# Patient Record
Sex: Female | Born: 1965 | Race: White | Hispanic: Yes | Marital: Married | Smoking: Never smoker
Health system: Southern US, Community
[De-identification: ages and names within clinical notes are randomized; demographics above are authoritative.]

## PROBLEM LIST (undated history)

## (undated) DIAGNOSIS — C189 Malignant neoplasm of colon, unspecified: Secondary | ICD-10-CM

## (undated) DIAGNOSIS — I10 Essential (primary) hypertension: Secondary | ICD-10-CM

## (undated) DIAGNOSIS — C801 Malignant (primary) neoplasm, unspecified: Secondary | ICD-10-CM

## (undated) DIAGNOSIS — Z8719 Personal history of other diseases of the digestive system: Secondary | ICD-10-CM

## (undated) DIAGNOSIS — K5790 Diverticulosis of intestine, part unspecified, without perforation or abscess without bleeding: Secondary | ICD-10-CM

## (undated) HISTORY — DX: Diverticulosis of intestine, part unspecified, without perforation or abscess without bleeding: K57.90

## (undated) HISTORY — DX: Essential (primary) hypertension: I10

## (undated) HISTORY — PX: BREAST EXCISIONAL BIOPSY: SUR124

## (undated) HISTORY — PX: CHOLECYSTECTOMY: SHX55

## (undated) HISTORY — PX: BREAST BIOPSY: SHX20

## (undated) HISTORY — DX: Malignant neoplasm of colon, unspecified: C18.9

---

## 1998-12-01 ENCOUNTER — Encounter: Admission: RE | Admit: 1998-12-01 | Discharge: 1998-12-01 | Payer: Self-pay | Admitting: Internal Medicine

## 1999-01-02 ENCOUNTER — Encounter: Admission: RE | Admit: 1999-01-02 | Discharge: 1999-01-02 | Payer: Self-pay | Admitting: Internal Medicine

## 2001-07-20 ENCOUNTER — Other Ambulatory Visit: Admission: RE | Admit: 2001-07-20 | Discharge: 2001-07-20 | Payer: Self-pay | Admitting: Family Medicine

## 2004-01-27 ENCOUNTER — Inpatient Hospital Stay (HOSPITAL_COMMUNITY): Admission: AD | Admit: 2004-01-27 | Discharge: 2004-01-29 | Payer: Self-pay | Admitting: Obstetrics & Gynecology

## 2005-09-22 ENCOUNTER — Other Ambulatory Visit: Admission: RE | Admit: 2005-09-22 | Discharge: 2005-09-22 | Payer: Self-pay | Admitting: Obstetrics and Gynecology

## 2005-12-20 HISTORY — PX: BREAST MASS EXCISION: SHX1267

## 2005-12-27 ENCOUNTER — Ambulatory Visit: Payer: Self-pay | Admitting: *Deleted

## 2006-01-11 ENCOUNTER — Encounter: Payer: Self-pay | Admitting: Cardiology

## 2006-01-11 ENCOUNTER — Ambulatory Visit: Payer: Self-pay

## 2006-01-18 ENCOUNTER — Ambulatory Visit: Payer: Self-pay | Admitting: *Deleted

## 2006-06-27 ENCOUNTER — Ambulatory Visit (HOSPITAL_COMMUNITY): Admission: RE | Admit: 2006-06-27 | Discharge: 2006-06-27 | Payer: Self-pay | Admitting: Obstetrics and Gynecology

## 2006-07-20 ENCOUNTER — Ambulatory Visit (HOSPITAL_COMMUNITY): Admission: RE | Admit: 2006-07-20 | Discharge: 2006-07-20 | Payer: Self-pay | Admitting: Obstetrics and Gynecology

## 2006-08-17 ENCOUNTER — Encounter: Admission: RE | Admit: 2006-08-17 | Discharge: 2006-08-17 | Payer: Self-pay | Admitting: General Surgery

## 2006-08-17 ENCOUNTER — Ambulatory Visit (HOSPITAL_BASED_OUTPATIENT_CLINIC_OR_DEPARTMENT_OTHER): Admission: RE | Admit: 2006-08-17 | Discharge: 2006-08-17 | Payer: Self-pay | Admitting: General Surgery

## 2006-08-18 ENCOUNTER — Encounter (INDEPENDENT_AMBULATORY_CARE_PROVIDER_SITE_OTHER): Payer: Self-pay | Admitting: *Deleted

## 2007-07-17 ENCOUNTER — Ambulatory Visit (HOSPITAL_COMMUNITY): Admission: RE | Admit: 2007-07-17 | Discharge: 2007-07-17 | Payer: Self-pay | Admitting: Obstetrics and Gynecology

## 2008-04-17 ENCOUNTER — Other Ambulatory Visit: Admission: RE | Admit: 2008-04-17 | Discharge: 2008-04-17 | Payer: Self-pay | Admitting: Obstetrics and Gynecology

## 2008-07-31 ENCOUNTER — Ambulatory Visit (HOSPITAL_COMMUNITY): Admission: RE | Admit: 2008-07-31 | Discharge: 2008-07-31 | Payer: Self-pay | Admitting: Obstetrics and Gynecology

## 2009-04-22 ENCOUNTER — Other Ambulatory Visit: Admission: RE | Admit: 2009-04-22 | Discharge: 2009-04-22 | Payer: Self-pay | Admitting: Obstetrics and Gynecology

## 2009-08-04 ENCOUNTER — Ambulatory Visit (HOSPITAL_COMMUNITY): Admission: RE | Admit: 2009-08-04 | Discharge: 2009-08-04 | Payer: Self-pay | Admitting: Obstetrics and Gynecology

## 2010-06-10 ENCOUNTER — Other Ambulatory Visit: Admission: RE | Admit: 2010-06-10 | Discharge: 2010-06-10 | Payer: Self-pay | Admitting: Obstetrics and Gynecology

## 2010-08-06 ENCOUNTER — Ambulatory Visit (HOSPITAL_COMMUNITY): Admission: RE | Admit: 2010-08-06 | Discharge: 2010-08-06 | Payer: Self-pay | Admitting: Obstetrics and Gynecology

## 2011-05-07 NOTE — H&P (Signed)
NAMEAREIL, OTTEY NO.:  [id number]   MEDICAL RECORD NO.:  [id number]                  PATIENT TYPE:   LOCATION:  LDR 1                                FACILITY:   PHYSICIAN:  Lazaro Arms, M.D.                DATE OF BIRTH:  [birth year]   DATE OF ADMISSION:  DATE OF DISCHARGE:                                HISTORY & PHYSICAL   HISTORY OF PRESENT ILLNESS:  Brittney [name] is a 45 year old Hispanic female, gravida  3, para 2, with estimated date of delivery of February 03, 2004, currently  at 39 weeks' gestation.  The patient suffers with chronic hypertension.  She  had been on blood pressure medicine prior to presenting for her first visit.  She had stopped them by the time we saw her.  Her initial blood pressure was  160/90, and we started her on Aldomet 500 mg p.o. b.i.d.  Her blood pressure  went up by 35 weeks to 160/90, and we increased it to t.i.d.  Her pregnancy  otherwise has been unremarkable.  Her Glucola was elevated, but a three-hour  GTT was normal.  As a result, she is admitted for Foley bulb placement and  induction because of 39 weeks' gestation and chronic hypertension.   PAST MEDICAL HISTORY:  Chronic hypertension.   PAST SURGICAL HISTORY:  Negative.   PAST OBSTETRICAL HISTORY:  She has had two vaginal deliveries in 1994 and  1997, both at term, both vaginal, she is unsure of the weights, and they  were both at [facility].   MEDICATIONS:  She is on no medications except the Aldomet and the prenatal  vitamins.   SOCIAL HISTORY:  She is married and works at [employer].   PRENATAL LABORATORY DATA:  Blood type is O positive, antibody screen is  negative.  Rubella is immune.  Hepatitis B is negative.  HIV is nonreactive.  Serology is nonreactive.  Pap was normal. GC and Chlamydia were negative.  Group B strep was negative.  Glucola, again, was elevated at 168 but three-  hour GTT was normal.   PHYSICAL EXAMINATION:  HEENT:  Unremarkable.  NECK:  Thyroid is normal.  CHEST:  Lungs are clear.  CARDIAC:  Regular rate and rhythm without murmurs, rubs, or gallops.  BREASTS:  Deferred.  ABDOMEN:  Gravid, fundal height of 38 cm.  PELVIC:  Cervix is 1, soft, posterior, vertex, and thick.   IMPRESSION:  1. Intrauterine pregnancy at 32 weeks' gestation.  2. Chronic hypertension.   PLAN:  The patient is admitted for Foley bulb placement and Pitocin  induction of labor.     ___________________________________________                                         Lazaro Arms, M.D.   LHE/MEDQ  D:  01/27/2004  T:  01/27/2004  Job:  [id number]

## 2011-05-07 NOTE — Op Note (Signed)
[name], [name]                         ACCOUNT NO.:  [id number]   MEDICAL RECORD NO.:  [id number]                   PATIENT TYPE:  INP   LOCATION:  LDR1                                 FACILITY:  [facility]   PHYSICIAN:  Tilda Burrow, M.D.              DATE OF BIRTH:  12/31/1965   DATE OF PROCEDURE:  DATE OF DISCHARGE:                                 OPERATIVE REPORT   DELIVERY TIME:  5:07 a.m.   PROCEDURE:  Vacuum assisted vaginal delivery.   DESCRIPTION OF PROCEDURE:  [name] was admitted on the evening of  February 7 for induction of labor.  A Foley balloon was attempted but could  not be placed into the cervix due to the posterior orientation of the  cervix, barely visible with a longer speculum. After failed speculum effort  and bimanual effort and inserting the balloon we proceeded with placement of  25 mcg Cytotec in the posterior fornix which was repeated four hours later.  The patient began to develop blood pressure elevation due to her chronic  hypertension combined with __________ contractions through the early morning  hours of January 28, 2004 requiring intravenous labetalol on several  occasions with baby tolerating labor nicely. She reached 8 cm dilated at 4  a.m., began to have membrane rupture spontaneous which revealed clear  amniotic fluid. She progressed to completely dilated just before 5 a.m. and  pushed through a brief second stage. This was accompanied by significant  decelerations into the 60's.  Recovery was slow but adequate.  Vacuum  assistance was used to expedite delivery and the patient delivered from a  direct OA position a healthy female 7 pounds and 13 ounces (3513 g) without  lacerations or complications.  The vertex was in a right occiput anterior  posterior at the time of expulsion.  The vacuum extractor was properly  placed over the posterior lambdoid suture.  The patient had loose nuchal  cord x1, the infant was placed on the maternal  abdomen, cord clamped and  then cut, then cord blood samples obtained.  The placenta delivered intact  but the membranes fragmented.  Ring forceps were used to extract a couple of  small fragments of membrane remnant from the cervical os.  Banjo curettage  was used with precaution briefly to confirm that no significant amounts of  membrane remained inside the mother's uterus.  Blood loss was 350 mL.      ___________________________________________                                            Tilda Burrow, M.D.   JVF/MEDQ  D:  01/28/2004  T:  01/28/2004  Job:  [id number]   cc:   Donna Bernard, M.D.  [address]  [place]  [place] [address]  Fax: [phone]

## 2011-05-07 NOTE — Op Note (Signed)
NAMEVALINCIA, TOUCH               ACCOUNT NO.:  [id number]   MEDICAL RECORD NO.:  [id number]          PATIENT TYPE:  AMB   LOCATION:  DSC                          FACILITY:  [facility]   PHYSICIAN:  Rose Phi. Maple Hudson, M.D.   DATE OF BIRTH:  [birth year]   DATE OF PROCEDURE:  08/17/2006  DATE OF DISCHARGE:                                 OPERATIVE REPORT   PREOPERATIVE DIAGNOSIS:  Radial scar of the left breast.   POSTOPERATIVE DIAGNOSIS:  Radial scar of the left breast.   OPERATION:  Left breast biopsy with needle localization and specimen  mammogram.   SURGEON:  Rose Phi. Maple Hudson, M.D.   ANESTHESIA:  General.   OPERATIVE PROCEDURE:  Prior to going to the operating room a localizing wire  been placed upper outer quadrant of the left breast.  After suitable general  anesthesia was induced, the left breast was prepped and draped in the usual  fashion.   A curved incision using the previously placed wire at about the 1:30  position was then outlined and then made.  A wide excision of the wire and  surrounding tissue was carried out.  Specimen mammography was performed.  Palpably, there was a little nodule just a medial to this which I excised as  a separate specimen.   With good hemostasis we closed the incision was subcuticular 4-0 Monocryl  and Steri-Strips.   Dressings were applied.  The patient transferred to the recovery room in  satisfactory condition having tolerated procedure well.      Rose Phi. Maple Hudson, M.D.  Electronically Signed     PRY/MEDQ  D:  08/17/2006  T:  08/18/2006  Job:  [id number]

## 2011-07-27 ENCOUNTER — Other Ambulatory Visit: Payer: Self-pay | Admitting: Obstetrics and Gynecology

## 2011-07-27 DIAGNOSIS — Z139 Encounter for screening, unspecified: Secondary | ICD-10-CM

## 2011-08-12 ENCOUNTER — Ambulatory Visit (HOSPITAL_COMMUNITY)
Admission: RE | Admit: 2011-08-12 | Discharge: 2011-08-12 | Disposition: A | Discharge status: Home / Self Care | Payer: 59 | Source: Ambulatory Visit | Attending: Obstetrics and Gynecology | Admitting: Obstetrics and Gynecology

## 2011-08-12 DIAGNOSIS — Z1231 Encounter for screening mammogram for malignant neoplasm of breast: Secondary | ICD-10-CM | POA: Insufficient documentation

## 2011-08-12 DIAGNOSIS — Z139 Encounter for screening, unspecified: Secondary | ICD-10-CM

## 2012-08-01 ENCOUNTER — Other Ambulatory Visit: Payer: Self-pay | Admitting: Obstetrics and Gynecology

## 2012-08-01 DIAGNOSIS — Z139 Encounter for screening, unspecified: Secondary | ICD-10-CM

## 2012-08-28 ENCOUNTER — Ambulatory Visit (HOSPITAL_COMMUNITY)
Admission: RE | Admit: 2012-08-28 | Discharge: 2012-08-28 | Disposition: A | Discharge status: Home / Self Care | Payer: 59 | Source: Ambulatory Visit | Attending: Obstetrics and Gynecology | Admitting: Obstetrics and Gynecology

## 2012-08-28 DIAGNOSIS — Z1231 Encounter for screening mammogram for malignant neoplasm of breast: Secondary | ICD-10-CM | POA: Insufficient documentation

## 2012-08-28 DIAGNOSIS — Z139 Encounter for screening, unspecified: Secondary | ICD-10-CM

## 2012-09-01 ENCOUNTER — Other Ambulatory Visit: Payer: Self-pay | Admitting: Obstetrics and Gynecology

## 2012-09-01 DIAGNOSIS — R928 Other abnormal and inconclusive findings on diagnostic imaging of breast: Secondary | ICD-10-CM

## 2012-09-04 ENCOUNTER — Other Ambulatory Visit: Payer: Self-pay | Admitting: Geriatric Medicine

## 2012-09-04 DIAGNOSIS — R928 Other abnormal and inconclusive findings on diagnostic imaging of breast: Secondary | ICD-10-CM

## 2012-09-07 ENCOUNTER — Other Ambulatory Visit: Payer: Self-pay | Admitting: Geriatric Medicine

## 2012-09-07 ENCOUNTER — Ambulatory Visit
Admission: RE | Admit: 2012-09-07 | Discharge: 2012-09-07 | Disposition: A | Discharge status: Home / Self Care | Payer: 59 | Source: Ambulatory Visit | Attending: Geriatric Medicine | Admitting: Geriatric Medicine

## 2012-09-07 DIAGNOSIS — R928 Other abnormal and inconclusive findings on diagnostic imaging of breast: Secondary | ICD-10-CM

## 2012-09-22 ENCOUNTER — Other Ambulatory Visit: Payer: Self-pay | Admitting: Cardiology

## 2012-09-22 DIAGNOSIS — R011 Cardiac murmur, unspecified: Secondary | ICD-10-CM

## 2012-09-25 ENCOUNTER — Other Ambulatory Visit (INDEPENDENT_AMBULATORY_CARE_PROVIDER_SITE_OTHER): Payer: 59

## 2012-09-25 ENCOUNTER — Other Ambulatory Visit: Payer: Self-pay

## 2012-09-25 DIAGNOSIS — R011 Cardiac murmur, unspecified: Secondary | ICD-10-CM

## 2012-09-27 ENCOUNTER — Encounter (HOSPITAL_COMMUNITY): Payer: Self-pay | Admitting: Geriatric Medicine

## 2012-10-03 ENCOUNTER — Other Ambulatory Visit: Payer: 59

## 2013-02-06 ENCOUNTER — Other Ambulatory Visit: Payer: Self-pay | Admitting: Obstetrics and Gynecology

## 2013-02-06 DIAGNOSIS — R922 Inconclusive mammogram: Secondary | ICD-10-CM

## 2013-02-12 ENCOUNTER — Ambulatory Visit
Admission: RE | Admit: 2013-02-12 | Discharge: 2013-02-12 | Disposition: A | Discharge status: Home / Self Care | Payer: 59 | Source: Ambulatory Visit | Attending: Obstetrics and Gynecology | Admitting: Obstetrics and Gynecology

## 2013-06-08 ENCOUNTER — Encounter: Payer: Self-pay | Admitting: Cardiovascular Disease

## 2013-06-08 ENCOUNTER — Ambulatory Visit (INDEPENDENT_AMBULATORY_CARE_PROVIDER_SITE_OTHER): Payer: 59 | Admitting: Cardiovascular Disease

## 2013-06-08 VITALS — BP 162/90 | HR 70 | Ht 66.0 in | Wt 153.8 lb

## 2013-06-08 DIAGNOSIS — R011 Cardiac murmur, unspecified: Secondary | ICD-10-CM | POA: Insufficient documentation

## 2013-06-08 DIAGNOSIS — I10 Essential (primary) hypertension: Secondary | ICD-10-CM

## 2013-06-08 MED ORDER — AMLODIPINE BESYLATE 5 MG PO TABS: 5.0000 mg | ORAL_TABLET | Freq: Every day | ORAL | Status: DC

## 2013-06-08 NOTE — Assessment & Plan Note (Signed)
The cardiac murmur does not seem to be as prominent as it was described on recent exam. I suspect that this is a flow murmur. I reviewed her echocardiogram which was done in October of last year which showed no significant valvular abnormalities and no evidence of LVOT obstruction.

## 2013-06-08 NOTE — Patient Instructions (Addendum)
Start Amlodipine 5 mg once daily. Continue other medications.   Your physician has requested that you have a renal artery duplex. During this test, an ultrasound is used to evaluate blood flow to the kidneys. Allow one hour for this exam. Do not eat after midnight the day before and avoid carbonated beverages. Take your medications as you usually do.  Follow up after test.

## 2013-06-08 NOTE — Assessment & Plan Note (Signed)
The patient has refractory hypertension. Her blood pressure is still significantly elevated in spite of being on 3 different blood pressure medications including a diuretic. I definitely think we should rule out secondary hypertension. Her physical exam is not suggestive of aortic coarctation given her preserved distal pulses. I reviewed her recent labs which showed unremarkable CBC and basic metabolic profile. Liver function tests were also normal with normal albumin level. Lipid profile was unremarkable except for mild hypertriglyceridemia. Thyroid function was normal. Based on that, it's unlikely that she has hyperaldosteronism or renal disease. I do think we have to rule out renal artery stenosis due to fibromuscular dysplasia. Thus, I will obtain renal artery duplex ultrasound. She has no symptoms suggestive of sleep apnea. I will add amlodipine 5 mg once daily. Continue other medications.

## 2013-06-08 NOTE — Progress Notes (Signed)
HPI  This is a pleasant 47 year old Hispanic female who was referred by Dr. Mayford Knife for evaluation of refractory hypertension and a cardiac murmur. The patient is not aware of any previous cardiac history. She reports being diagnosed with hypertension over the last few years which has been difficult to control lately. During a recent visit she was noted to have a blood pressure of 200/110. She was given one dose of clonidine. She has been on lisinopril, hydrochlorothiazide and Bisoprolol.   She denies any chest pain, dyspnea or palpitations.she reports no headache, dizziness or syncope. She doesn't know exactly her family history but does report that her mother was diagnosed with hypertension at a similar age. She was noted recently to have a 2/6 systolic murmur. This was noted also last year. She underwent an echocardiogram in October of last year which overall was unremarkable.   No Known Allergies   No current outpatient prescriptions on file prior to visit.   No current facility-administered medications on file prior to visit.     Past Medical History  Diagnosis Date  . Hypertension      Past Surgical History  Procedure Laterality Date  . Cholecystectomy       History reviewed. No pertinent family history.   History   Social History  . Marital Status: Married    Spouse Name: N/A    Number of Children: N/A  . Years of Education: N/A   Occupational History  . Not on file.   Social History Main Topics  . Smoking status: Never Smoker   . Smokeless tobacco: Not on file  . Alcohol Use: No  . Drug Use: No  . Sexually Active: Not on file   Other Topics Concern  . Not on file   Social History Narrative  . No narrative on file     ROS Constitutional: Negative for fever, chills, diaphoresis, activity change, appetite change and fatigue.  HENT: Negative for hearing loss, nosebleeds, congestion, sore throat, facial swelling, drooling, trouble swallowing, neck  pain, voice change, sinus pressure and tinnitus.  Eyes: Negative for photophobia, pain, discharge and visual disturbance.  Respiratory: Negative for apnea, cough, chest tightness, shortness of breath and wheezing.  Cardiovascular: Negative for chest pain, palpitations and leg swelling.  Gastrointestinal: Negative for nausea, vomiting, abdominal pain, diarrhea, constipation, blood in stool and abdominal distention.  Genitourinary: Negative for dysuria, urgency, frequency, hematuria and decreased urine volume.  Musculoskeletal: Negative for myalgias, back pain, joint swelling, arthralgias and gait problem.  Skin: Negative for color change, pallor, rash and wound.  Neurological: Negative for dizziness, tremors, seizures, syncope, speech difficulty, weakness, light-headedness, numbness and headaches.  Psychiatric/Behavioral: Negative for suicidal ideas, hallucinations, behavioral problems and agitation. The patient is not nervous/anxious.     PHYSICAL EXAM   BP 162/90  Pulse 70  Ht 5\' 6"  (1.676 m)  Wt 153 lb 12 oz (69.741 kg)  BMI 24.83 kg/m2 Constitutional: She is oriented to person, place, and time. She appears well-developed and well-nourished. No distress.  HENT: No nasal discharge.  Head: Normocephalic and atraumatic.  Eyes: Pupils are equal and round. Right eye exhibits no discharge. Left eye exhibits no discharge.  Neck: Normal range of motion. Neck supple. No JVD present. No thyromegaly present.  Cardiovascular: Normal rate, regular rhythm, normal heart sounds. Exam reveals no gallop and no friction rub. There is a 1/6 systolic ejection murmur at the aortic area.  Pulmonary/Chest: Effort normal and breath sounds normal. No stridor. No respiratory distress. She  has no wheezes. She has no rales. She exhibits no tenderness.  Abdominal: Soft. Bowel sounds are normal. She exhibits no distension. There is no tenderness. There is no rebound and no guarding. No abdominal bruit    Musculoskeletal: Normal range of motion. She exhibits no edema and no tenderness.  Neurological: She is alert and oriented to person, place, and time. Coordination normal.  Skin: Skin is warm and dry. No rash noted. She is not diaphoretic. No erythema. No pallor.  Psychiatric: She has a normal mood and affect. Her behavior is normal. Judgment and thought content normal.  Normal distal pedal pulses.   ZOX:WRUEA  Rhythm  WITHIN NORMAL LIMITS   ASSESSMENT AND PLAN

## 2013-07-25 ENCOUNTER — Other Ambulatory Visit: Payer: Self-pay | Admitting: Obstetrics and Gynecology

## 2013-07-25 DIAGNOSIS — N6002 Solitary cyst of left breast: Secondary | ICD-10-CM

## 2013-09-05 ENCOUNTER — Encounter (INDEPENDENT_AMBULATORY_CARE_PROVIDER_SITE_OTHER): Payer: 59

## 2013-09-05 ENCOUNTER — Ambulatory Visit
Admission: RE | Admit: 2013-09-05 | Discharge: 2013-09-05 | Disposition: A | Discharge status: Home / Self Care | Payer: 59 | Source: Ambulatory Visit | Attending: Obstetrics and Gynecology | Admitting: Obstetrics and Gynecology

## 2013-09-05 DIAGNOSIS — I10 Essential (primary) hypertension: Secondary | ICD-10-CM

## 2013-09-05 DIAGNOSIS — N6002 Solitary cyst of left breast: Secondary | ICD-10-CM

## 2013-09-06 ENCOUNTER — Telehealth: Payer: Self-pay

## 2013-09-06 NOTE — Telephone Encounter (Signed)
Message copied by [name] on Thu Sep 06, 2013  2:37 PM ------      Message from: Brittney [name]      Created: Thu Sep 06, 2013  1:33 PM       Inform patient that renal artery ultrasound was normal. She is due for [name] follow up. ------

## 2013-09-06 NOTE — Telephone Encounter (Signed)
Spoke w/ pt's husband.  He will make pt aware of results and have her call the office for a f/u appt.

## 2014-02-12 IMAGING — US US BREAST*R*
1 series · 9 of 9 positions shown · non-contrast
Comparison: 08/28/2012 and earlier

CLINICAL DATA: The patient returns after screening study for
evaluation of the left breast.

DIGITAL DIAGNOSTIC LEFT MAMMOGRAM WITH CAD AND LEFT BREAST
ULTRASOUND:

[Series 1: us breast*right* · 9 of 9 slices shown]
[im 1/9]
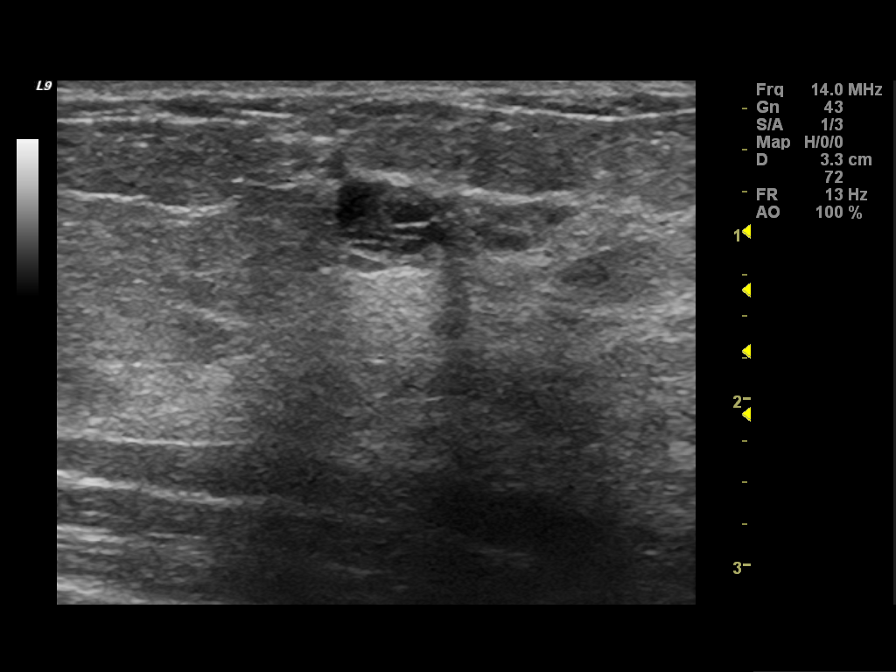
[im 2/9]
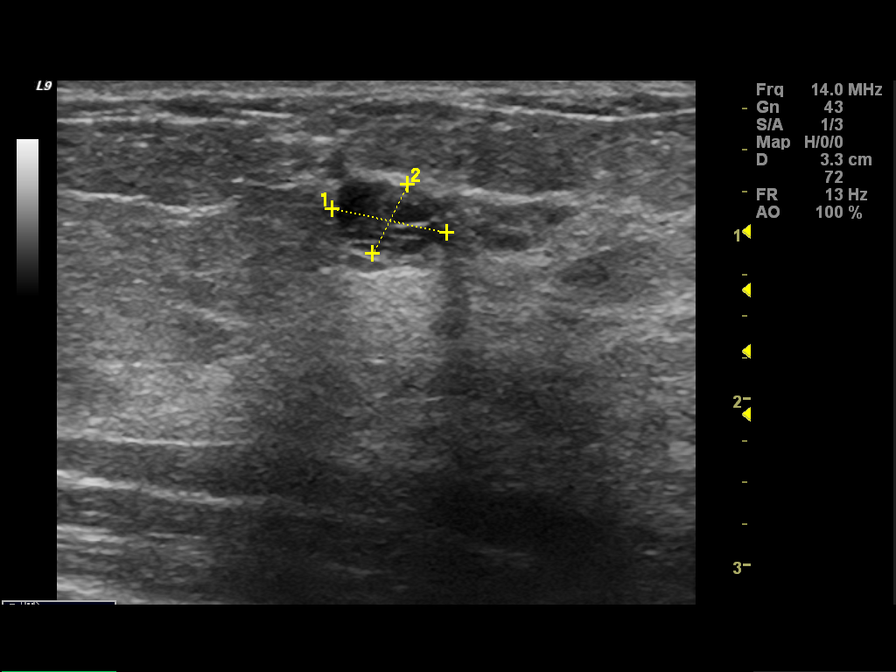
[im 3/9]
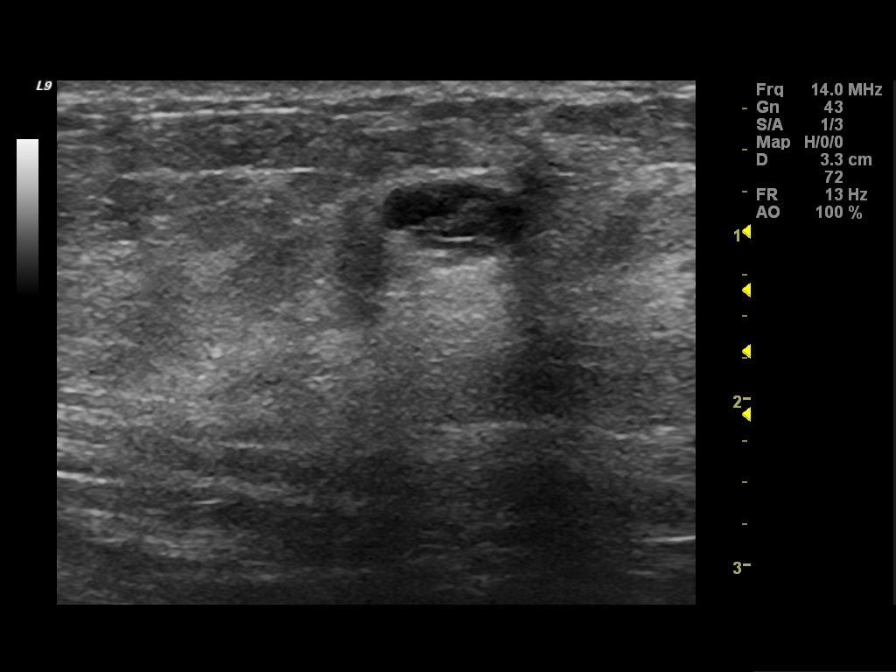
[im 4/9]
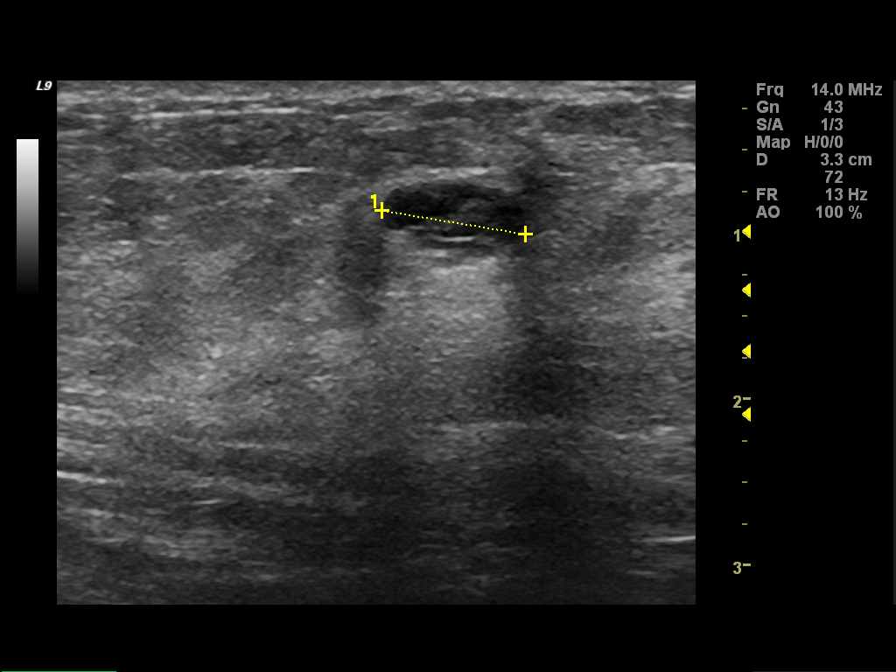
[im 5/9]
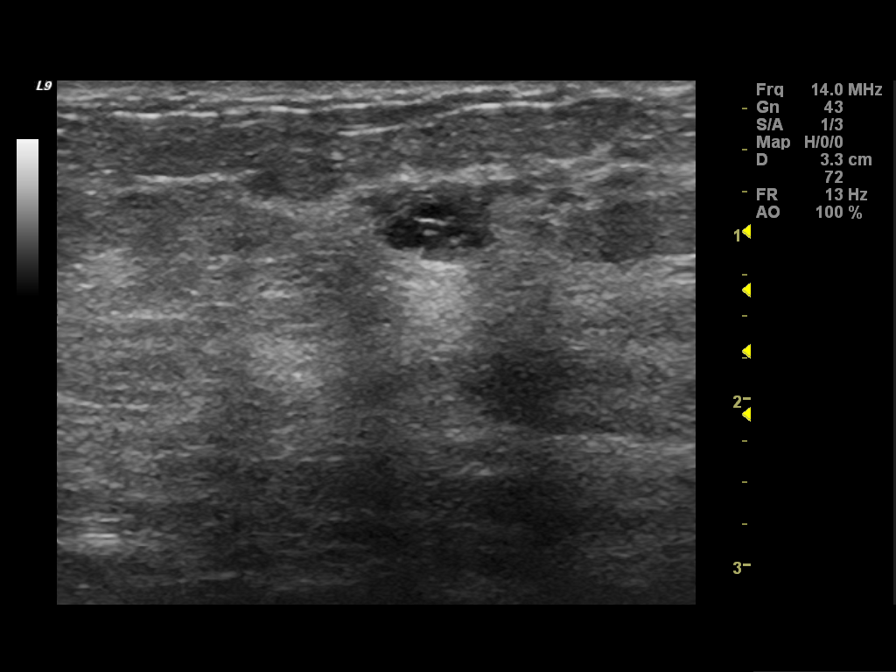
[im 6/9]
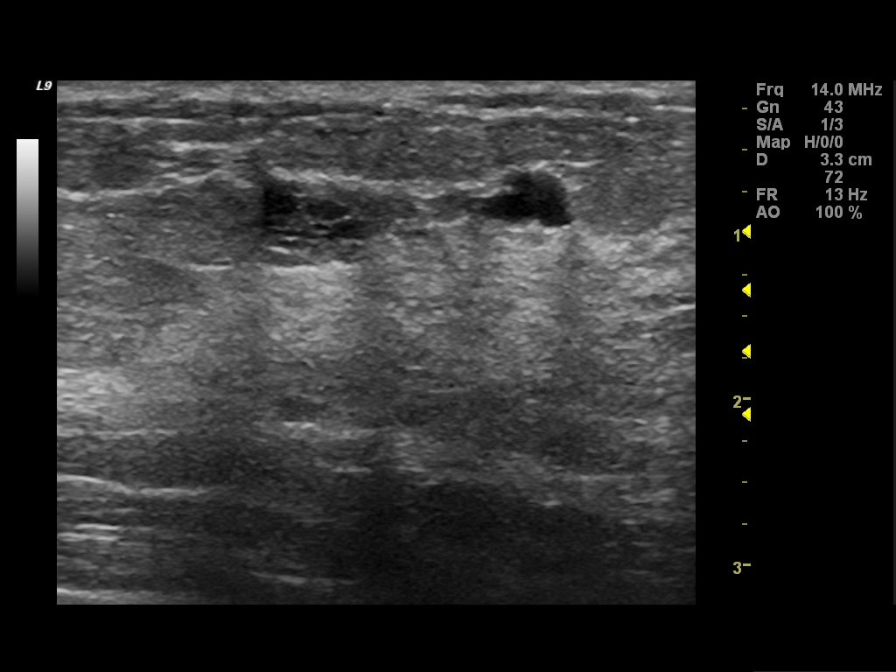
[im 7/9]
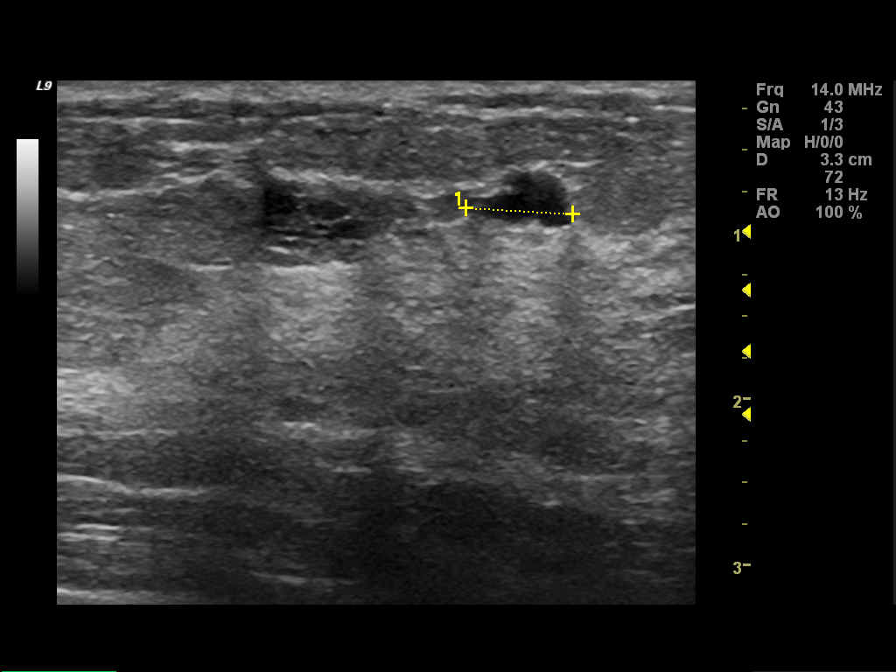
[im 8/9]
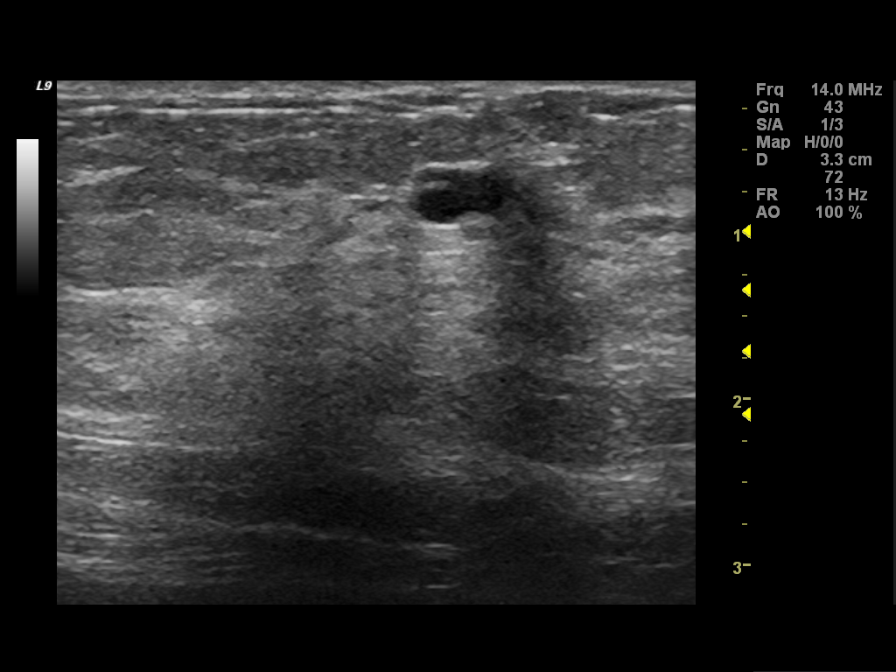
[im 9/9]
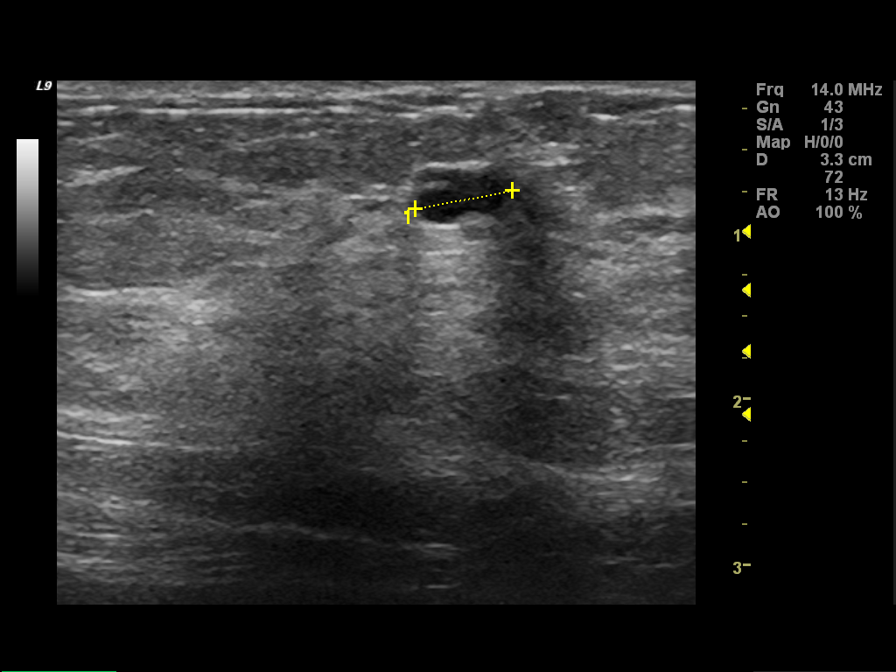

[9 of 9 positions shown; findings below may reference images not displayed]

FINDINGS: Breast parenchyma is extremely dense.  Spot compression
views showed dense fibroglandular tissue in the medial aspect of
the left breast.  No persistent mass or distortion identified.
Scattered, benign-appearing calcifications are present.
Mammographic images were processed with CAD.

On physical exam, I palpate no abnormality within the medial
portion of the left breast.

Ultrasound is performed, showing a to foci of apocrine metaplasia
within the 11 o'clock location of the left breast, 7 cm from
nipple.  These measures 7 x 5 x 9 mm and 6 x 6 mm.  No solid mass
or area of acoustic shadowing identified.
IMPRESSION: No mammographic or ultrasound evidence for malignancy.
Probable apocrine metaplasia in the 11 o'clock location of the left
breast warrants further follow-up.  I discussed the findings with
the patient and her son, who serves as interpreter.  Questions were
answered.

RECOMMENDATION:
Left breast ultrasound 6 months.

BI-RADS CATEGORY 3:  Probably benign finding(s) - short interval
follow-up suggested.

## 2014-02-13 IMAGING — MG MM DIGITAL DIAGNOSTIC UNILAT*L*
2 series · 2 of 2 positions shown · non-contrast
Comparison: 08/28/2012 and earlier

CLINICAL DATA: The patient returns after screening study for
evaluation of the left breast.

DIGITAL DIAGNOSTIC LEFT MAMMOGRAM WITH CAD AND LEFT BREAST
ULTRASOUND:

[L CC]
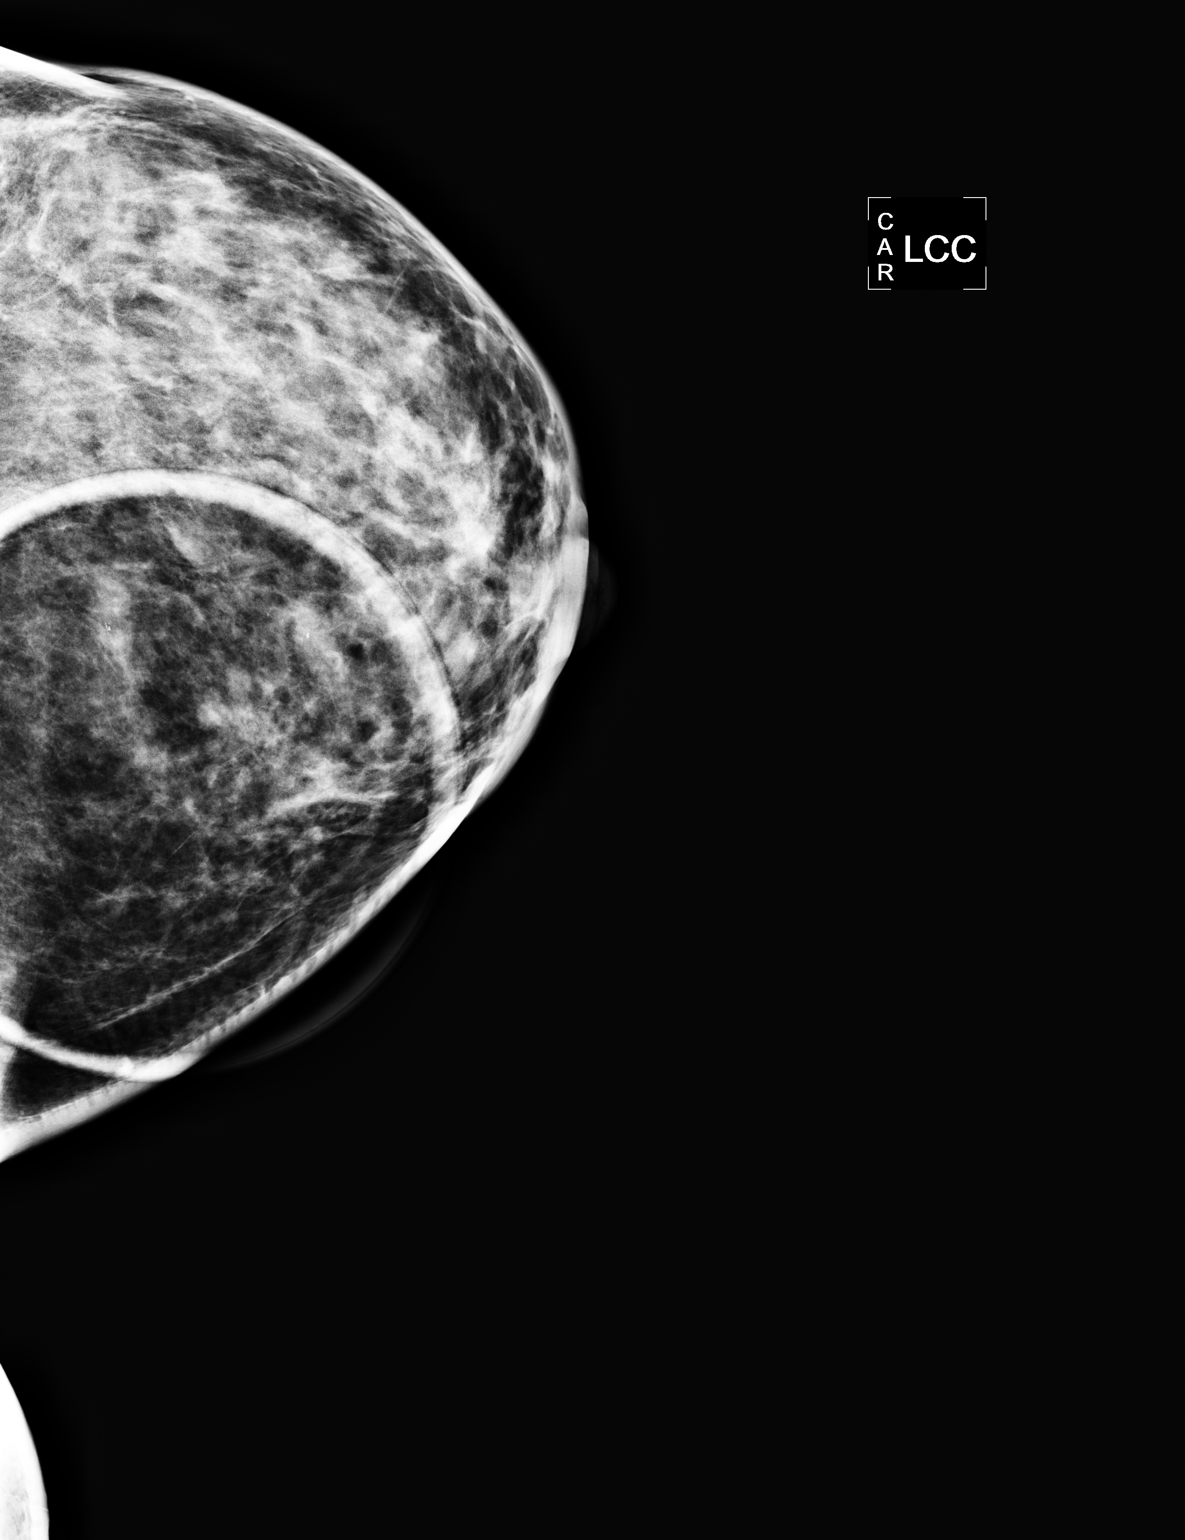

[L ML]
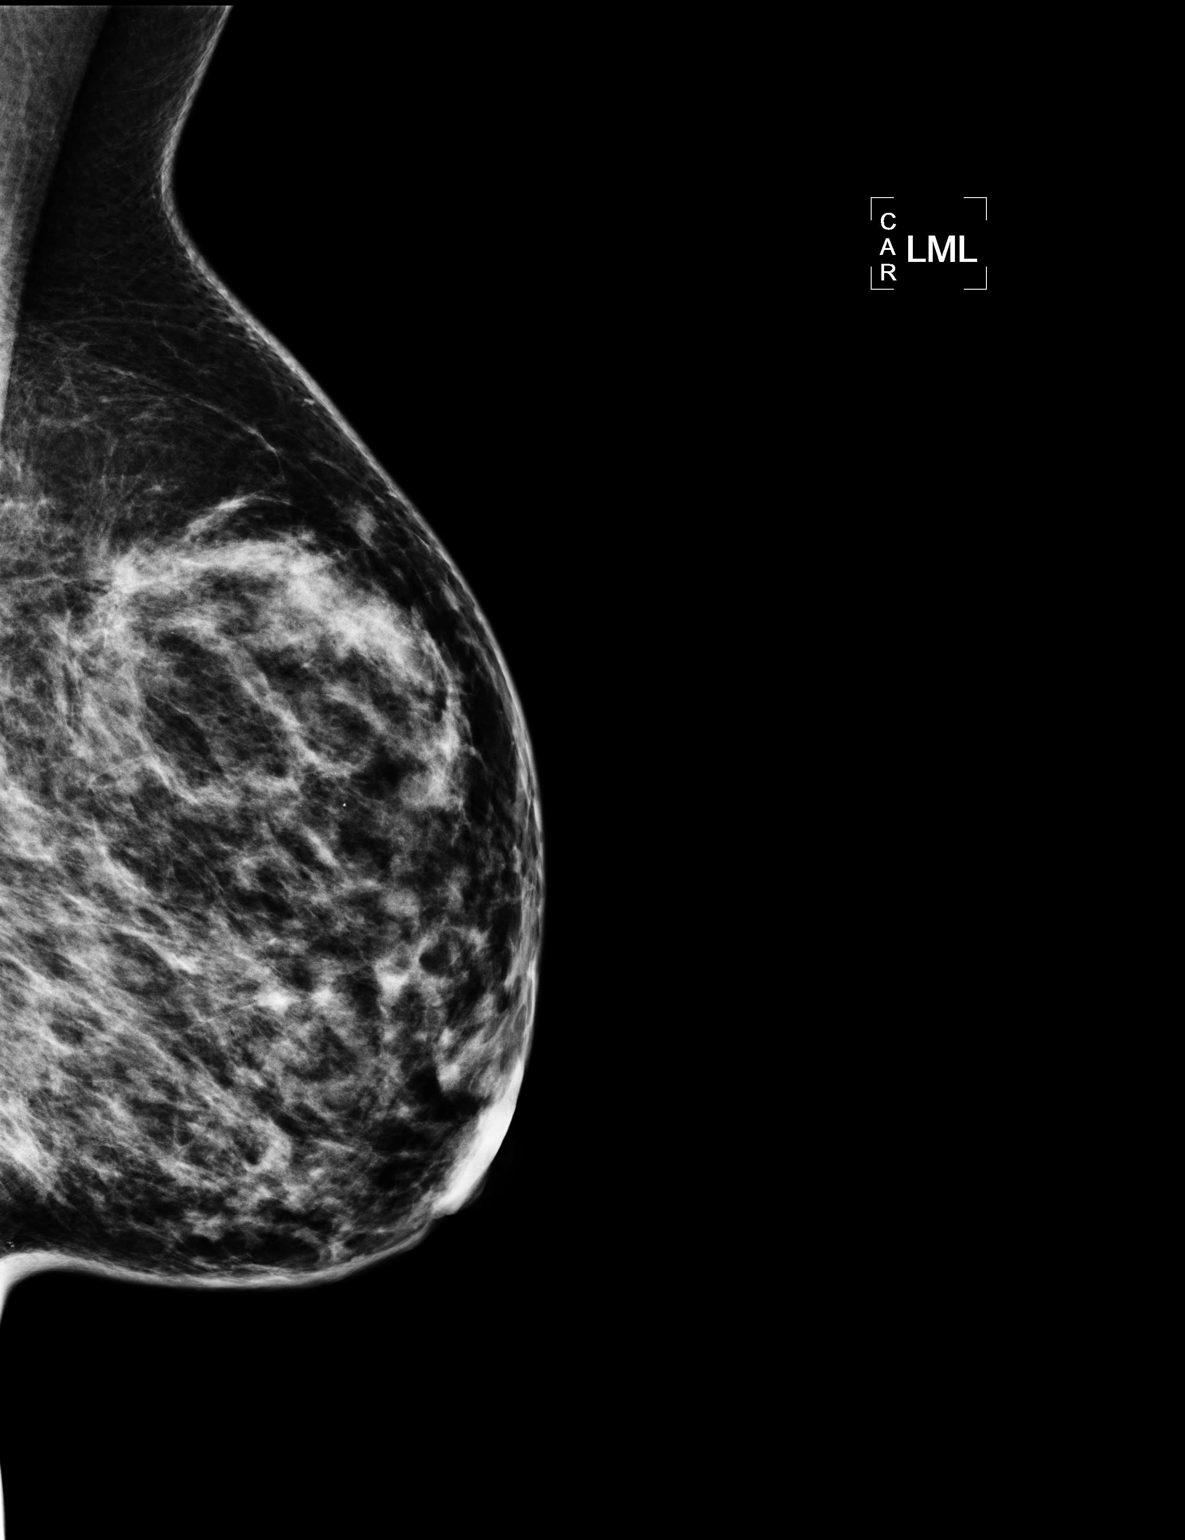

[2 of 2 positions shown; findings below may reference images not displayed]

FINDINGS: Breast parenchyma is extremely dense.  Spot compression
views showed dense fibroglandular tissue in the medial aspect of
the left breast.  No persistent mass or distortion identified.
Scattered, benign-appearing calcifications are present.
Mammographic images were processed with CAD.

On physical exam, I palpate no abnormality within the medial
portion of the left breast.

Ultrasound is performed, showing a to foci of apocrine metaplasia
within the 11 o'clock location of the left breast, 7 cm from
nipple.  These measures 7 x 5 x 9 mm and 6 x 6 mm.  No solid mass
or area of acoustic shadowing identified.
IMPRESSION: No mammographic or ultrasound evidence for malignancy.
Probable apocrine metaplasia in the 11 o'clock location of the left
breast warrants further follow-up.  I discussed the findings with
the patient and her son, who serves as interpreter.  Questions were
answered.

RECOMMENDATION:
Left breast ultrasound 6 months.

BI-RADS CATEGORY 3:  Probably benign finding(s) - short interval
follow-up suggested.

## 2014-09-06 ENCOUNTER — Other Ambulatory Visit: Payer: Self-pay

## 2014-09-06 DIAGNOSIS — Z1231 Encounter for screening mammogram for malignant neoplasm of breast: Secondary | ICD-10-CM

## 2014-09-09 ENCOUNTER — Encounter (INDEPENDENT_AMBULATORY_CARE_PROVIDER_SITE_OTHER): Payer: Self-pay

## 2014-09-09 ENCOUNTER — Ambulatory Visit
Admission: RE | Admit: 2014-09-09 | Discharge: 2014-09-09 | Disposition: A | Discharge status: Home / Self Care | Payer: BC Managed Care – PPO | Source: Ambulatory Visit

## 2014-09-09 DIAGNOSIS — Z1231 Encounter for screening mammogram for malignant neoplasm of breast: Secondary | ICD-10-CM

## 2015-07-02 ENCOUNTER — Encounter: Payer: Self-pay | Admitting: Gastroenterology

## 2015-08-27 ENCOUNTER — Ambulatory Visit (AMBULATORY_SURGERY_CENTER): Payer: Self-pay | Admitting: *Deleted

## 2015-08-27 VITALS — Ht 62.0 in | Wt 156.0 lb

## 2015-08-27 DIAGNOSIS — Z1211 Encounter for screening for malignant neoplasm of colon: Secondary | ICD-10-CM

## 2015-08-27 NOTE — Progress Notes (Signed)
Patient denies any allergies to eggs or soy. Patient denies any problems with anesthesia/sedation. Patient denies any oxygen use at home and does not take any diet/weight loss medications.  

## 2015-09-09 ENCOUNTER — Other Ambulatory Visit (INDEPENDENT_AMBULATORY_CARE_PROVIDER_SITE_OTHER): Payer: BLUE CROSS/BLUE SHIELD

## 2015-09-09 ENCOUNTER — Ambulatory Visit (AMBULATORY_SURGERY_CENTER): Payer: BLUE CROSS/BLUE SHIELD | Admitting: Gastroenterology

## 2015-09-09 ENCOUNTER — Encounter: Payer: Self-pay | Admitting: Gastroenterology

## 2015-09-09 ENCOUNTER — Other Ambulatory Visit: Payer: Self-pay

## 2015-09-09 VITALS — BP 151/90 | HR 80 | Temp 99.1°F | Resp 16 | Ht 62.0 in | Wt 156.0 lb

## 2015-09-09 DIAGNOSIS — Z1211 Encounter for screening for malignant neoplasm of colon: Secondary | ICD-10-CM

## 2015-09-09 DIAGNOSIS — R933 Abnormal findings on diagnostic imaging of other parts of digestive tract: Secondary | ICD-10-CM | POA: Diagnosis not present

## 2015-09-09 DIAGNOSIS — K6389 Other specified diseases of intestine: Secondary | ICD-10-CM

## 2015-09-09 DIAGNOSIS — C187 Malignant neoplasm of sigmoid colon: Secondary | ICD-10-CM | POA: Diagnosis not present

## 2015-09-09 LAB — CBC WITH DIFFERENTIAL/PLATELET
Basophils Absolute: 0 10*3/uL (ref 0.0–0.1)
Basophils Relative: 0.4 % (ref 0.0–3.0)
EOS PCT: 1 % (ref 0.0–5.0)
Eosinophils Absolute: 0.1 10*3/uL (ref 0.0–0.7)
HEMATOCRIT: 43 % (ref 36.0–46.0)
Hemoglobin: 14.5 g/dL (ref 12.0–15.0)
LYMPHS ABS: 1.2 10*3/uL (ref 0.7–4.0)
Lymphocytes Relative: 18.9 % (ref 12.0–46.0)
MCHC: 33.6 g/dL (ref 30.0–36.0)
MCV: 89.4 fl (ref 78.0–100.0)
MONOS PCT: 5.1 % (ref 3.0–12.0)
Monocytes Absolute: 0.3 10*3/uL (ref 0.1–1.0)
NEUTROS ABS: 4.9 10*3/uL (ref 1.4–7.7)
NEUTROS PCT: 74.6 % (ref 43.0–77.0)
PLATELETS: 333 10*3/uL (ref 150.0–400.0)
RBC: 4.81 Mil/uL (ref 3.87–5.11)
RDW: 13.8 % (ref 11.5–15.5)
WBC: 6.6 10*3/uL (ref 4.0–10.5)

## 2015-09-09 LAB — COMPREHENSIVE METABOLIC PANEL
ALK PHOS: 80 U/L (ref 39–117)
ALT: 30 U/L (ref 0–35)
AST: 19 U/L (ref 0–37)
Albumin: 4.4 g/dL (ref 3.5–5.2)
BUN: 8 mg/dL (ref 6–23)
CALCIUM: 9.3 mg/dL (ref 8.4–10.5)
CO2: 26 meq/L (ref 19–32)
Chloride: 105 mEq/L (ref 96–112)
Creatinine, Ser: 0.77 mg/dL (ref 0.40–1.20)
GFR: 84.08 mL/min (ref >60.00)
GLUCOSE: 105 mg/dL — AB (ref 70–99)
POTASSIUM: 3.2 meq/L — AB (ref 3.5–5.1)
Sodium: 140 mEq/L (ref 135–145)
TOTAL PROTEIN: 7.3 g/dL (ref 6.0–8.3)
Total Bilirubin: 0.5 mg/dL (ref 0.2–1.2)

## 2015-09-09 LAB — PROTIME-INR
INR: 1.2 ratio — ABNORMAL HIGH (ref 0.8–1.0)
Prothrombin Time: 13.6 s — ABNORMAL HIGH (ref 9.6–13.1)

## 2015-09-09 LAB — CEA: CEA: 0.7 ng/mL (ref 0.0–5.0)

## 2015-09-09 MED ORDER — SODIUM CHLORIDE 0.9 % IV SOLN: 500.0000 mL | INTRAVENOUS | Status: DC

## 2015-09-09 NOTE — Patient Instructions (Addendum)
YOU HAD AN ENDOSCOPIC PROCEDURE TODAY AT [facility]:   Refer to the procedure report that was given to you for any specific questions about what was found during the examination.  If the procedure report does not answer your questions, please call your gastroenterologist to clarify.  If you requested that your care partner not be given the details of your procedure findings, then the procedure report has been included in a sealed envelope for you to review at your convenience later.  YOU SHOULD EXPECT: Some feelings of bloating in the abdomen. Passage of more gas than usual.  Walking can help get rid of the air that was put into your GI tract during the procedure and reduce the bloating. If you had a lower endoscopy (such as a colonoscopy or flexible sigmoidoscopy) you may notice spotting of blood in your stool or on the toilet paper. If you underwent a bowel prep for your procedure, you may not have a normal bowel movement for a few days.  Please Note:  You might notice some irritation and congestion in your nose or some drainage.  This is from the oxygen used during your procedure.  There is no need for concern and it should clear up in a day or so.  SYMPTOMS TO REPORT IMMEDIATELY:   Following lower endoscopy (colonoscopy or flexible sigmoidoscopy):  Excessive amounts of blood in the stool  Significant tenderness or worsening of abdominal pains  Swelling of the abdomen that is new, acute  Fever of 100F or higher    Black, tarry-looking stools  For urgent or emergent issues, a gastroenterologist can be reached at any hour by calling [phone].   DIET: Your first meal following the procedure should be a small meal and then it is ok to progress to your normal diet. Heavy or fried foods are harder to digest and may make you feel nauseous or bloated.  Likewise, meals heavy in dairy and vegetables can increase bloating.  Drink plenty of fluids but you should avoid alcoholic  beverages for 24 hours.  ACTIVITY:  You should plan to take it easy for the rest of today and you should NOT DRIVE or use heavy machinery until tomorrow (because of the sedation medicines used during the test).    FOLLOW UP: Our staff will call the number listed on your records the next business day following your procedure to check on you and address any questions or concerns that you may have regarding the information given to you following your procedure. If we do not reach you, we will leave a message.  However, if you are feeling well and you are not experiencing any problems, there is no need to return our call.  We will assume that you have returned to your regular daily activities without incident.  If any biopsies were taken you will be contacted by phone or by letter within the next 1-3 weeks.  Please call us at [phone] if you have not heard about the biopsies in 3 weeks.    SIGNATURES/CONFIDENTIALITY: You and/or your care partner have signed paperwork which will be entered into your electronic medical record.  These signatures attest to the fact that that the information above on your After Visit Summary has been reviewed and is understood.  Full responsibility of the confidentiality of this discharge information lies with you and/or your care-partner.    LAB  WORK TO  BE DONE ON DISCHARGE TODAY  You have been scheduled for a  CT scan of the abdomen and pelvis at [facility] ([address]---this is in the same building as Press [facility]).   You are scheduled on 09/11/15 at 3:00pm. You should arrive 15 minutes prior to your appointment time for registration. Please follow the written instructions below on the day of your exam:  WARNING: IF YOU ARE ALLERGIC TO IODINE/X-RAY DYE, PLEASE NOTIFY RADIOLOGY IMMEDIATELY AT [phone]! YOU WILL BE GIVEN A 13 HOUR PREMEDICATION PREP.  1) Do not eat or drink anything after 11:00AM (4 hours prior to your test) 2) You  have been given 2 bottles of oral contrast to drink. The solution may taste  better if refrigerated, but do NOT add ice or any other liquid to this solution. Shake  well before drinking.    Drink 1 bottle of contrast @ 1:00pm (2 hours prior to your exam)  Drink 1 bottle of contrast @ 2:00pm (1 hour prior to your exam)  You may take any medications as prescribed with a small amount of water except for the following: Metformin, Glucophage, Glucovance, Avandamet, Riomet, Fortamet, Actoplus Met, Janumet, Glumetza or Metaglip. The above medications must be held the day of the exam AND 48 hours after the exam.  The purpose of you drinking the oral contrast is to aid in the visualization of your intestinal tract. The contrast solution may cause some diarrhea. Before your exam is started, you will be given a small amount of fluid to drink. Depending on your individual set of symptoms, you may also receive an intravenous injection of x-ray contrast/dye. Plan on being at [facility] for 30 minutes or long, depending on the type of exam you are having performed.  This test typically takes 30-45 minutes to complete.  If you have any questions regarding your exam or if you need to reschedule, you may call the CT department at [phone] between the hours of 8:00 am and 5:00 pm, Monday-Friday.  ________________________________________________________________________

## 2015-09-09 NOTE — Progress Notes (Signed)
Called to room to assist during endoscopic procedure.  Patient ID and intended procedure confirmed with present staff. Received instructions for my participation in the procedure from the performing physician.  

## 2015-09-09 NOTE — Progress Notes (Signed)
Report to PACU, RN, vss, BBS= Clear.  

## 2015-09-09 NOTE — Progress Notes (Signed)
CT SCAN HAS BEEN SET UP AND TIME AND DATE REVIEWED WITH PT BY ANNETTE WILLIS RN AND YESENIA FROM 3RD FLOOR HELPED WITH INTERPRETATION

## 2015-09-09 NOTE — Op Note (Signed)
[facility]  [employer]. [place], [address]   COLONOSCOPY PROCEDURE REPORT  PATIENT: Brittney [name], Brittney [name]  MR#: [id number] BIRTHDATE: 1966-09-11 , 48  yrs. old GENDER: female ENDOSCOPIST: Milus Banister, MD REFERRED [id number] Glennon Mac, P.A. PROCEDURE DATE:  09/09/2015 PROCEDURE:   Colonoscopy, screening, Colonoscopy with biopsy, and Submucosal injection, any substance First Screening Colonoscopy - Avg.  risk and is 50 yrs.  old or older Yes.  Prior Negative Screening - Now for repeat screening. N/A  History of Adenoma - Now for follow-up colonoscopy & has been > or = to 3 yrs.  N/A  high risk ASA CLASS:   Class II INDICATIONS:Screening for colonic neoplasia and Colorectal Neoplasm Risk Assessment for this procedure is average risk. MEDICATIONS: Monitored anesthesia care and Propofol 200 mg IV  DESCRIPTION OF PROCEDURE:   After the risks benefits and alternatives of the procedure were thoroughly explained, informed consent was obtained.  The digital rectal exam revealed no abnormalities of the rectum.   The LB [id number]-H190 [id number]  endoscope was introduced through the anus and advanced to the cecum, which was identified by both the appendix and ileocecal valve. No adverse events experienced.   The quality of the prep was excellent.  The instrument was then slowly withdrawn as the colon was fully examined. Estimated blood loss is zero unless otherwise noted in this procedure report.   COLON FINDINGS: There was a clearly malignant mass in the sigmoid colon (about 30cm from anal verge).  This was non-circumferential and non-obstructive, about 4cm across, ulcerated.  The mass was sampled with biopsy forceps and then labeled at the distal edge with submucosal injection of SPOT.  The examination was otherwise normal.  Retroflexed views revealed no abnormalities. The time to cecum = 2.0 Withdrawal time = 14.4   The scope was withdrawn and the procedure  completed. COMPLICATIONS: There were no immediate complications.  ENDOSCOPIC IMPRESSION: There was a clearly malignant mass in the sigmoid colon (about 30cm from anal verge).  This was non-circumferential and non-obstructive, about 4cm across, ulcerated.  The mass was sampled with biopsy forceps and then labeled at the distal edge with submucosal injection of SPOT.  The examination was otherwise normal   RECOMMENDATIONS: Staging workup to be ordered (CT scan chest, abdomen, pelvis; labs with CEA, CBC, CMET, INR).  My office will arrange referral to general surgery to consider resection for newly diagnosed sigmoid colon cancer.  You will need repeat colonoscopy 1 year post resection.  eSigned:  Milus Banister, MD 09/09/2015 9:03 AM

## 2015-09-09 NOTE — Progress Notes (Signed)
SPOKE WITH Brittney [name] IN DR JACOBS OFFICE WHO IS SETTING UP LABS AND SURGEON CONSULT

## 2015-09-10 ENCOUNTER — Telehealth: Payer: Self-pay | Admitting: *Deleted

## 2015-09-10 NOTE — Telephone Encounter (Signed)
  Follow up Call-  Call back number 09/09/2015  Post procedure Call Back phone  # [phone]  Permission to leave phone message Yes     Patient questions:  Do you have a fever, pain , or abdominal swelling? No. Pain Score  0 *  Have you tolerated food without any problems? Yes.    Have you been able to return to your normal activities? No.  Do you have any questions about your discharge instructions: Diet   No. Medications  No. Follow up visit  No.  Do you have questions or concerns about your Care? No.  Actions: * If pain score is 4 or above: No action needed, pain <4.

## 2015-09-11 ENCOUNTER — Ambulatory Visit (INDEPENDENT_AMBULATORY_CARE_PROVIDER_SITE_OTHER)
Admission: RE | Admit: 2015-09-11 | Discharge: 2015-09-11 | Disposition: A | Discharge status: Home / Self Care | Payer: BLUE CROSS/BLUE SHIELD | Source: Ambulatory Visit | Attending: Gastroenterology | Admitting: Gastroenterology

## 2015-09-11 DIAGNOSIS — K6389 Other specified diseases of intestine: Secondary | ICD-10-CM

## 2015-09-11 DIAGNOSIS — R933 Abnormal findings on diagnostic imaging of other parts of digestive tract: Secondary | ICD-10-CM

## 2015-09-11 MED ORDER — IOHEXOL 300 MG/ML  SOLN
100.0000 mL | Freq: Once | INTRAMUSCULAR | Status: AC | PRN
  Administered 2015-09-11: 100 mL via INTRAVENOUS

## 2015-09-17 ENCOUNTER — Telehealth: Payer: Self-pay | Admitting: Gastroenterology

## 2015-09-18 NOTE — Telephone Encounter (Signed)
I spoke to Brittney [name] today thurs 09-18-15 about her Path Results as per Dr Ardis Hughs asked me to in [place]. She understands and has no questions. She was a little concerned she has not heard back regarding her surgical date yet from [facility].

## 2015-09-18 NOTE — Telephone Encounter (Signed)
Barbie Haggis can you please call her with this info thanks,  Dr Barry Dienes 09/22/15 arrive at 2:30 pm

## 2015-09-18 NOTE — Telephone Encounter (Signed)
Spoke to [place] and gave her date for her Consult with Dr Barry Dienes, address and phone number. Her son will go with her to the appointment to interpret. Has no further questions. She wanted me to tell Dr Ardis Hughs thank you for everything.

## 2015-09-22 ENCOUNTER — Other Ambulatory Visit: Payer: Self-pay | Admitting: General Surgery

## 2015-10-27 ENCOUNTER — Other Ambulatory Visit: Payer: Self-pay

## 2015-10-27 ENCOUNTER — Encounter (HOSPITAL_COMMUNITY): Payer: Self-pay

## 2015-10-27 ENCOUNTER — Ambulatory Visit (HOSPITAL_COMMUNITY)
Admission: RE | Admit: 2015-10-27 | Discharge: 2015-10-27 | Disposition: A | Discharge status: Home / Self Care | Payer: BLUE CROSS/BLUE SHIELD | Source: Ambulatory Visit | Attending: Anesthesiology | Admitting: Anesthesiology

## 2015-10-27 ENCOUNTER — Encounter (HOSPITAL_COMMUNITY)
Admission: RE | Admit: 2015-10-27 | Discharge: 2015-10-27 | Disposition: A | Discharge status: Home / Self Care | Payer: BLUE CROSS/BLUE SHIELD | Source: Ambulatory Visit | Attending: General Surgery | Admitting: General Surgery

## 2015-10-27 DIAGNOSIS — I10 Essential (primary) hypertension: Secondary | ICD-10-CM | POA: Diagnosis not present

## 2015-10-27 DIAGNOSIS — R938 Abnormal findings on diagnostic imaging of other specified body structures: Secondary | ICD-10-CM | POA: Diagnosis present

## 2015-10-27 DIAGNOSIS — Z01818 Encounter for other preprocedural examination: Secondary | ICD-10-CM | POA: Diagnosis present

## 2015-10-27 DIAGNOSIS — Z01812 Encounter for preprocedural laboratory examination: Secondary | ICD-10-CM | POA: Insufficient documentation

## 2015-10-27 DIAGNOSIS — R9389 Abnormal findings on diagnostic imaging of other specified body structures: Secondary | ICD-10-CM

## 2015-10-27 HISTORY — DX: Malignant (primary) neoplasm, unspecified: C80.1

## 2015-10-27 HISTORY — DX: Personal history of other diseases of the digestive system: Z87.19

## 2015-10-27 LAB — BASIC METABOLIC PANEL
ANION GAP: 8 (ref 5–15)
BUN: 12 mg/dL (ref 6–20)
CALCIUM: 10.1 mg/dL (ref 8.9–10.3)
CO2: 26 mmol/L (ref 22–32)
CREATININE: 0.74 mg/dL (ref 0.44–1.00)
Chloride: 107 mmol/L (ref 101–111)
GFR calc Af Amer: >60 mL/min (ref >60)
GLUCOSE: 113 mg/dL — AB (ref 65–99)
Potassium: 4.3 mmol/L (ref 3.5–5.1)
Sodium: 141 mmol/L (ref 135–145)

## 2015-10-27 LAB — CBC WITH DIFFERENTIAL/PLATELET
Basophils Absolute: 0 10*3/uL (ref 0.0–0.1)
Basophils Relative: 1 %
EOS ABS: 0.1 10*3/uL (ref 0.0–0.7)
EOS PCT: 1 %
HCT: 43.4 % (ref 36.0–46.0)
Hemoglobin: 15.2 g/dL — ABNORMAL HIGH (ref 12.0–15.0)
LYMPHS ABS: 1.5 10*3/uL (ref 0.7–4.0)
LYMPHS PCT: 19 %
MCH: 30.9 pg (ref 26.0–34.0)
MCHC: 35 g/dL (ref 30.0–36.0)
MCV: 88.2 fL (ref 78.0–100.0)
MONO ABS: 0.4 10*3/uL (ref 0.1–1.0)
MONOS PCT: 5 %
Neutro Abs: 5.8 10*3/uL (ref 1.7–7.7)
Neutrophils Relative %: 74 %
PLATELETS: 314 10*3/uL (ref 150–400)
RBC: 4.92 MIL/uL (ref 3.87–5.11)
RDW: 13.4 % (ref 11.5–15.5)
WBC: 7.9 10*3/uL (ref 4.0–10.5)

## 2015-10-27 LAB — PROTIME-INR
INR: 0.99 (ref 0.00–1.49)
Prothrombin Time: 13.3 seconds (ref 11.6–15.2)

## 2015-10-27 LAB — ABO/RH: ABO/RH(D): O POS

## 2015-10-27 NOTE — Patient Instructions (Addendum)
Brittney [name]  10/27/2015   Your procedure is scheduled on: Wednesday October 29, 2015  Report to [facility]  Entrance take [facility]  elevators to 3rd floor to  [place] at 7:00 AM.  Call this number if you have problems the morning of surgery [phone]   Remember: ONLY 1 PERSON MAY GO WITH YOU TO SHORT STAY TO GET  READY MORNING OF [place].  BEGIN CLEAR LIQUID DIET DAY PRIOR TO SURGERY. DO NOT EAT ANY FOOD OR DRINK ANY LIQUIDS BEGINNING MIDNIGHT PRIOR TO SURGERY DATE.     Take these medicines the morning of surgery with A SIP OF WATER: NONE                               You may not have any metal on your body including hair pins and              piercings  Do not wear jewelry, make-up, lotions, powders or perfumes, deodorant             Do not wear nail polish.  Do not shave  48 hours prior to surgery.                Do not bring valuables to the hospital. [place].  Contacts, dentures or bridgework may not be worn into surgery.  Leave suitcase in the car. After surgery it may be brought to your room.              FOLLOW BOWEL PREPARATION  PRIOR TO SURGERY DATE  PER SURGEON'S INSTRUCTIONS   Please read over the following fact sheets you were given:BLOOD TRANSFUSION INFORMATION SHEET _____________________________________________________________________             [facility] - Preparing for Surgery Before surgery, you can play an important role.  Because skin is not sterile, your skin needs to be as free of germs as possible.  You can reduce the number of germs on your skin by washing with CHG (chlorahexidine gluconate) soap before surgery.  CHG is an antiseptic cleaner which kills germs and bonds with the skin to continue killing germs even after washing. Please DO NOT use if you have an allergy to CHG or antibacterial soaps.  If your skin becomes reddened/irritated stop using the CHG and  inform your nurse when you arrive at Short Stay. Do not shave (including legs and underarms) for at least 48 hours prior to the first CHG shower.  You may shave your face/neck. Please follow these instructions carefully:  1.  Shower with CHG Soap the night before surgery and the  morning of Surgery.  2.  If you choose to wash your hair, wash your hair first as usual with your  normal  shampoo.  3.  After you shampoo, rinse your hair and body thoroughly to remove the  shampoo.                           4.  Use CHG as you would any other liquid soap.  You can apply chg directly  to the skin and wash  Gently with a scrungie or clean washcloth.  5.  Apply the CHG Soap to your body ONLY FROM THE NECK DOWN.   Do not use on face/ open                           Wound or open sores. Avoid contact with eyes, ears mouth and genitals (private parts).                       Wash face,  Genitals (private parts) with your normal soap.             6.  Wash thoroughly, paying special attention to the area where your surgery  will be performed.  7.  Thoroughly rinse your body with warm water from the neck down.  8.  DO NOT shower/wash with your normal soap after using and rinsing off  the CHG Soap.                9.  Pat yourself dry with a clean towel.            10.  Wear clean pajamas.            11.  Place clean sheets on your bed the night of your first shower and do not  sleep with pets. Day of Surgery : Do not apply any lotions/deodorants the morning of surgery.  Please wear clean clothes to the hospital/surgery center.  FAILURE TO FOLLOW THESE INSTRUCTIONS MAY RESULT IN THE CANCELLATION OF YOUR SURGERY PATIENT SIGNATURE_________________________________  NURSE SIGNATURE__________________________________  ________________________________________________________________________  WHAT IS A BLOOD TRANSFUSION? Blood Transfusion Information  A transfusion is the replacement of blood or  some of its parts. Blood is made up of multiple cells which provide different functions.  Red blood cells carry oxygen and are used for blood loss replacement.  White blood cells fight against infection.  Platelets control bleeding.  Plasma helps clot blood.  Other blood products are available for specialized needs, such as hemophilia or other clotting disorders. BEFORE THE TRANSFUSION  Who gives blood for transfusions?   Healthy volunteers who are fully evaluated to make sure their blood is safe. This is blood bank blood. Transfusion therapy is the safest it has ever been in the practice of medicine. Before blood is taken from a donor, a complete history is taken to make sure that person has no history of diseases nor engages in risky social behavior (examples are intravenous drug use or sexual activity with multiple partners). The donor's travel history is screened to minimize risk of transmitting infections, such as malaria. The donated blood is tested for signs of infectious diseases, such as HIV and hepatitis. The blood is then tested to be sure it is compatible with you in order to minimize the chance of a transfusion reaction. If you or a relative donates blood, this is often done in anticipation of surgery and is not appropriate for emergency situations. It takes many days to process the donated blood. RISKS AND COMPLICATIONS Although transfusion therapy is very safe and saves many lives, the main dangers of transfusion include:   Getting an infectious disease.  Developing a transfusion reaction. This is an allergic reaction to something in the blood you were given. Every precaution is taken to prevent this. The decision to have a blood transfusion has been considered carefully by your caregiver before blood is given. Blood is not given unless the benefits outweigh  the risks. AFTER THE TRANSFUSION  Right after receiving a blood transfusion, you will usually feel much better and more  energetic. This is especially true if your red blood cells have gotten low (anemic). The transfusion raises the level of the red blood cells which carry oxygen, and this usually causes an energy increase.  The nurse administering the transfusion will monitor you carefully for complications. HOME CARE INSTRUCTIONS  No special instructions are needed after a transfusion. You may find your energy is better. Speak with your caregiver about any limitations on activity for underlying diseases you may have. SEEK MEDICAL CARE IF:   Your condition is not improving after your transfusion.  You develop redness or irritation at the intravenous (IV) site. SEEK IMMEDIATE MEDICAL CARE IF:  Any of the following symptoms occur over the next 12 hours:  Shaking chills.  You have a temperature by mouth above 102 F (38.9 C), not controlled by medicine.  Chest, back, or muscle pain.  People around you feel you are not acting correctly or are confused.  Shortness of breath or difficulty breathing.  Dizziness and fainting.  You get a rash or develop hives.  You have a decrease in urine output.  Your urine turns a dark color or changes to pink, red, or brown. Any of the following symptoms occur over the next 10 days:  You have a temperature by mouth above 102 F (38.9 C), not controlled by medicine.  Shortness of breath.  Weakness after normal activity.  The white part of the eye turns yellow (jaundice).  You have a decrease in the amount of urine or are urinating less often.  Your urine turns a dark color or changes to pink, red, or brown. Document Released: 12/03/2000 Document Revised: 02/28/2012 Document Reviewed: 07/22/2008 ExitCare Patient Information 2014 ExitCare, [place].  _______________________________________________________________________    CLEAR LIQUID DIET   Foods Allowed                                                                     Foods Excluded  Coffee and tea,  regular and decaf                             liquids that you cannot  Plain Jell-O in any flavor                                             see through such as: Fruit ices (not with fruit pulp)                                     milk, soups, orange juice  Iced Popsicles                                    All solid food Carbonated beverages, regular and diet  Cranberry, grape and apple juices Sports drinks like Gatorade Lightly seasoned clear broth or consume(fat free) Sugar, honey syrup  Sample Menu Breakfast                                Lunch                                     Supper Cranberry juice                    Beef broth                            Chicken broth Jell-O                                     Grape juice                           Apple juice Coffee or tea                        Jell-O                                      Popsicle                                                Coffee or tea                        Coffee or tea  _____________________________________________________________________

## 2015-10-27 NOTE — Progress Notes (Signed)
LOV note per cardiology / Dr Fletcher Anon 06/08/2013/epic ECHO results per epic 09/25/2012

## 2015-10-28 LAB — HEMOGLOBIN A1C
Hgb A1c MFr Bld: 6.1 % — ABNORMAL HIGH (ref 4.8–5.6)
MEAN PLASMA GLUCOSE: 128 mg/dL

## 2015-10-28 NOTE — Progress Notes (Signed)
Pt has interpreter present during [name] visit 10/27/2015 - Brittney [name]

## 2015-10-28 NOTE — Progress Notes (Signed)
HGB A1C results in epic per [name] visit 10/27/2015 sent to Dr Barry Dienes

## 2015-10-29 ENCOUNTER — Encounter (HOSPITAL_COMMUNITY)
Admission: RE | Disposition: A | Discharge status: Home / Self Care | Payer: Self-pay | Source: Ambulatory Visit | Attending: General Surgery

## 2015-10-29 ENCOUNTER — Encounter (HOSPITAL_COMMUNITY): Payer: Self-pay | Admitting: Anesthesiology

## 2015-10-29 ENCOUNTER — Inpatient Hospital Stay (HOSPITAL_COMMUNITY)
Admission: RE | Admit: 2015-10-29 | Discharge: 2015-11-02 | DRG: 330 | Disposition: A | Discharge status: Home / Self Care | Payer: BLUE CROSS/BLUE SHIELD | Source: Ambulatory Visit | Attending: General Surgery | Admitting: General Surgery

## 2015-10-29 ENCOUNTER — Inpatient Hospital Stay (HOSPITAL_COMMUNITY): Payer: BLUE CROSS/BLUE SHIELD | Admitting: Anesthesiology

## 2015-10-29 DIAGNOSIS — Z803 Family history of malignant neoplasm of breast: Secondary | ICD-10-CM

## 2015-10-29 DIAGNOSIS — C187 Malignant neoplasm of sigmoid colon: Secondary | ICD-10-CM | POA: Diagnosis present

## 2015-10-29 DIAGNOSIS — K567 Ileus, unspecified: Secondary | ICD-10-CM | POA: Diagnosis not present

## 2015-10-29 DIAGNOSIS — C772 Secondary and unspecified malignant neoplasm of intra-abdominal lymph nodes: Secondary | ICD-10-CM | POA: Diagnosis present

## 2015-10-29 HISTORY — PX: COLON SURGERY: SHX602

## 2015-10-29 LAB — CBC
HEMATOCRIT: 40.7 % (ref 36.0–46.0)
Hemoglobin: 14.1 g/dL (ref 12.0–15.0)
MCH: 30.3 pg (ref 26.0–34.0)
MCHC: 34.6 g/dL (ref 30.0–36.0)
MCV: 87.5 fL (ref 78.0–100.0)
PLATELETS: 308 10*3/uL (ref 150–400)
RBC: 4.65 MIL/uL (ref 3.87–5.11)
RDW: 13.5 % (ref 11.5–15.5)
WBC: 13.6 10*3/uL — AB (ref 4.0–10.5)

## 2015-10-29 LAB — CREATININE, SERUM: Creatinine, Ser: 0.93 mg/dL (ref 0.44–1.00)

## 2015-10-29 LAB — TYPE AND SCREEN
ABO/RH(D): O POS
ANTIBODY SCREEN: NEGATIVE

## 2015-10-29 SURGERY — COLECTOMY, PARTIAL, ROBOT-ASSISTED, LAPAROSCOPIC
Anesthesia: General | Site: Abdomen

## 2015-10-29 MED ORDER — HYDROCHLOROTHIAZIDE 25 MG PO TABS
25.0000 mg | ORAL_TABLET | Freq: Every day | ORAL | Status: DC
  Administered 2015-10-29 – 2015-11-02 (×5): 25 mg via ORAL
  Filled 2015-10-29 (×5): qty 1

## 2015-10-29 MED ORDER — HYDROMORPHONE HCL 1 MG/ML IJ SOLN: 0.2500 mg | INTRAMUSCULAR | Status: DC | PRN

## 2015-10-29 MED ORDER — PROPOFOL 10 MG/ML IV BOLUS
INTRAVENOUS | Status: AC
  Filled 2015-10-29: qty 20

## 2015-10-29 MED ORDER — ALVIMOPAN 12 MG PO CAPS
12.0000 mg | ORAL_CAPSULE | Freq: Once | ORAL | Status: AC
  Administered 2015-10-29: 12 mg via ORAL
  Filled 2015-10-29: qty 1

## 2015-10-29 MED ORDER — ALUM & MAG HYDROXIDE-SIMETH 200-200-20 MG/5ML PO SUSP
30.0000 mL | Freq: Four times a day (QID) | ORAL | Status: DC | PRN
Start: 2015-10-29 — End: 2015-11-02

## 2015-10-29 MED ORDER — LIDOCAINE HCL (CARDIAC) 20 MG/ML IV SOLN
INTRAVENOUS | Status: DC | PRN
  Administered 2015-10-29: 50 mg via INTRAVENOUS

## 2015-10-29 MED ORDER — IRBESARTAN 300 MG PO TABS
300.0000 mg | ORAL_TABLET | Freq: Every day | ORAL | Status: DC
  Administered 2015-10-29 – 2015-11-02 (×5): 300 mg via ORAL
  Filled 2015-10-29 (×5): qty 1

## 2015-10-29 MED ORDER — FENTANYL CITRATE (PF) 100 MCG/2ML IJ SOLN
INTRAMUSCULAR | Status: DC | PRN
  Administered 2015-10-29: 100 ug via INTRAVENOUS
  Administered 2015-10-29 (×3): 50 ug via INTRAVENOUS

## 2015-10-29 MED ORDER — BUPIVACAINE-EPINEPHRINE 0.25% -1:200000 IJ SOLN
INTRAMUSCULAR | Status: DC | PRN
  Administered 2015-10-29: 15 mL

## 2015-10-29 MED ORDER — LIDOCAINE HCL (PF) 1 % IJ SOLN
INTRAMUSCULAR | Status: DC | PRN
  Administered 2015-10-29: 15 mL

## 2015-10-29 MED ORDER — CEFOTETAN DISODIUM-DEXTROSE 2-2.08 GM-% IV SOLR
INTRAVENOUS | Status: AC
  Filled 2015-10-29: qty 50

## 2015-10-29 MED ORDER — DEXAMETHASONE SODIUM PHOSPHATE 10 MG/ML IJ SOLN
INTRAMUSCULAR | Status: DC | PRN
  Administered 2015-10-29: 10 mg via INTRAVENOUS

## 2015-10-29 MED ORDER — ZOLPIDEM TARTRATE 5 MG PO TABS: 5.0000 mg | ORAL_TABLET | Freq: Every evening | ORAL | Status: DC | PRN

## 2015-10-29 MED ORDER — NEOSTIGMINE METHYLSULFATE 10 MG/10ML IV SOLN
INTRAVENOUS | Status: AC
  Filled 2015-10-29: qty 1

## 2015-10-29 MED ORDER — LIP MEDEX EX OINT
TOPICAL_OINTMENT | CUTANEOUS | Status: AC
  Administered 2015-10-29: 18:00:00
  Filled 2015-10-29: qty 7

## 2015-10-29 MED ORDER — LACTATED RINGERS IV SOLN
INTRAVENOUS | Status: DC
  Administered 2015-10-29: 12:00:00 via INTRAVENOUS
  Administered 2015-10-29: 1000 mL via INTRAVENOUS

## 2015-10-29 MED ORDER — 0.9 % SODIUM CHLORIDE (POUR BTL) OPTIME
TOPICAL | Status: DC | PRN
  Administered 2015-10-29: 2000 mL

## 2015-10-29 MED ORDER — DEXTROSE 5 % IV SOLN
2.0000 g | INTRAVENOUS | Status: AC
  Administered 2015-10-29: 2 g via INTRAVENOUS

## 2015-10-29 MED ORDER — ALVIMOPAN 12 MG PO CAPS
12.0000 mg | ORAL_CAPSULE | Freq: Two times a day (BID) | ORAL | Status: DC
  Administered 2015-10-30 – 2015-11-01 (×5): 12 mg via ORAL
  Filled 2015-10-29 (×6): qty 1

## 2015-10-29 MED ORDER — ACETAMINOPHEN 325 MG PO TABS: 650.0000 mg | ORAL_TABLET | Freq: Four times a day (QID) | ORAL | Status: DC | PRN

## 2015-10-29 MED ORDER — MIDAZOLAM HCL 2 MG/2ML IJ SOLN
INTRAMUSCULAR | Status: AC
  Filled 2015-10-29: qty 4

## 2015-10-29 MED ORDER — OLMESARTAN MEDOXOMIL-HCTZ 40-25 MG PO TABS: 1.0000 | ORAL_TABLET | Freq: Every day | ORAL | Status: DC

## 2015-10-29 MED ORDER — SODIUM CHLORIDE 0.9 % IJ SOLN
INTRAMUSCULAR | Status: AC
  Filled 2015-10-29: qty 10

## 2015-10-29 MED ORDER — HYDROMORPHONE HCL 1 MG/ML IJ SOLN
INTRAMUSCULAR | Status: DC | PRN
  Administered 2015-10-29 (×4): 0.5 mg via INTRAVENOUS

## 2015-10-29 MED ORDER — ENOXAPARIN SODIUM 40 MG/0.4ML ~~LOC~~ SOLN
40.0000 mg | SUBCUTANEOUS | Status: DC
  Administered 2015-10-30 – 2015-11-02 (×4): 40 mg via SUBCUTANEOUS
  Filled 2015-10-29 (×6): qty 0.4

## 2015-10-29 MED ORDER — MORPHINE SULFATE 2 MG/ML IV SOLN
INTRAVENOUS | Status: AC
  Filled 2015-10-29: qty 25

## 2015-10-29 MED ORDER — BUPIVACAINE LIPOSOME 1.3 % IJ SUSP
20.0000 mL | Freq: Once | INTRAMUSCULAR | Status: AC
  Administered 2015-10-29: 20 mL
  Filled 2015-10-29: qty 20

## 2015-10-29 MED ORDER — DIPHENHYDRAMINE HCL 50 MG/ML IJ SOLN: 12.5000 mg | Freq: Four times a day (QID) | INTRAMUSCULAR | Status: DC | PRN

## 2015-10-29 MED ORDER — NALOXONE HCL 0.4 MG/ML IJ SOLN: 0.4000 mg | INTRAMUSCULAR | Status: DC | PRN

## 2015-10-29 MED ORDER — DIPHENHYDRAMINE HCL 12.5 MG/5ML PO ELIX: 12.5000 mg | ORAL_SOLUTION | Freq: Four times a day (QID) | ORAL | Status: DC | PRN

## 2015-10-29 MED ORDER — MORPHINE SULFATE 2 MG/ML IV SOLN
INTRAVENOUS | Status: DC
  Administered 2015-10-29: 1.5 mg via INTRAVENOUS
  Administered 2015-10-30 (×2): 0 mg via INTRAVENOUS
  Administered 2015-10-30: 12 mg via INTRAVENOUS
  Administered 2015-10-31: 1.5 mg via INTRAVENOUS
  Administered 2015-10-31: 0 mg via INTRAVENOUS
  Administered 2015-10-31 – 2015-11-01 (×2): 1.5 mg via INTRAVENOUS
  Administered 2015-11-01: 0 mg via INTRAVENOUS
  Administered 2015-11-02: 8.2 mg via INTRAVENOUS

## 2015-10-29 MED ORDER — KETOROLAC TROMETHAMINE 30 MG/ML IJ SOLN
30.0000 mg | Freq: Four times a day (QID) | INTRAMUSCULAR | Status: AC
  Administered 2015-10-29 – 2015-10-31 (×8): 30 mg via INTRAVENOUS
  Filled 2015-10-29 (×11): qty 1

## 2015-10-29 MED ORDER — INFLUENZA VAC SPLIT QUAD 0.5 ML IM SUSY
0.5000 mL | PREFILLED_SYRINGE | INTRAMUSCULAR | Status: AC
  Administered 2015-10-30: 0.5 mL via INTRAMUSCULAR
  Filled 2015-10-29 (×3): qty 0.5

## 2015-10-29 MED ORDER — MORPHINE SULFATE (PF) 2 MG/ML IV SOLN: 1.0000 mg | INTRAVENOUS | Status: DC | PRN

## 2015-10-29 MED ORDER — EPHEDRINE SULFATE 50 MG/ML IJ SOLN
INTRAMUSCULAR | Status: AC
  Filled 2015-10-29: qty 1

## 2015-10-29 MED ORDER — ROCURONIUM BROMIDE 100 MG/10ML IV SOLN
INTRAVENOUS | Status: AC
  Filled 2015-10-29: qty 1

## 2015-10-29 MED ORDER — PROMETHAZINE HCL 25 MG/ML IJ SOLN: 6.2500 mg | INTRAMUSCULAR | Status: DC | PRN

## 2015-10-29 MED ORDER — ONDANSETRON HCL 4 MG PO TABS: 4.0000 mg | ORAL_TABLET | Freq: Four times a day (QID) | ORAL | Status: DC | PRN

## 2015-10-29 MED ORDER — OXYCODONE HCL 5 MG PO TABS: 5.0000 mg | ORAL_TABLET | Freq: Once | ORAL | Status: DC | PRN

## 2015-10-29 MED ORDER — CEFOTETAN DISODIUM 2 G IJ SOLR
2.0000 g | Freq: Two times a day (BID) | INTRAMUSCULAR | Status: AC
  Administered 2015-10-29: 2 g via INTRAVENOUS
  Filled 2015-10-29: qty 2

## 2015-10-29 MED ORDER — SODIUM CHLORIDE 0.9 % IJ SOLN: 9.0000 mL | INTRAMUSCULAR | Status: DC | PRN

## 2015-10-29 MED ORDER — GLYCOPYRROLATE 0.2 MG/ML IJ SOLN
INTRAMUSCULAR | Status: DC | PRN
  Administered 2015-10-29: 0.6 mg via INTRAVENOUS

## 2015-10-29 MED ORDER — NEOSTIGMINE METHYLSULFATE 10 MG/10ML IV SOLN
INTRAVENOUS | Status: DC | PRN
  Administered 2015-10-29: 3 mg via INTRAVENOUS

## 2015-10-29 MED ORDER — LIDOCAINE HCL (CARDIAC) 20 MG/ML IV SOLN
INTRAVENOUS | Status: AC
  Filled 2015-10-29: qty 5

## 2015-10-29 MED ORDER — FENTANYL CITRATE (PF) 250 MCG/5ML IJ SOLN
INTRAMUSCULAR | Status: AC
  Filled 2015-10-29: qty 25

## 2015-10-29 MED ORDER — METOPROLOL TARTRATE 1 MG/ML IV SOLN
5.0000 mg | Freq: Four times a day (QID) | INTRAVENOUS | Status: DC | PRN
  Administered 2015-11-01: 5 mg via INTRAVENOUS
  Filled 2015-10-29 (×3): qty 5

## 2015-10-29 MED ORDER — GLYCOPYRROLATE 0.2 MG/ML IJ SOLN
INTRAMUSCULAR | Status: AC
  Filled 2015-10-29: qty 3

## 2015-10-29 MED ORDER — KETOROLAC TROMETHAMINE 30 MG/ML IJ SOLN: 30.0000 mg | Freq: Once | INTRAMUSCULAR | Status: DC

## 2015-10-29 MED ORDER — PHENYLEPHRINE 40 MCG/ML (10ML) SYRINGE FOR IV PUSH (FOR BLOOD PRESSURE SUPPORT)
PREFILLED_SYRINGE | INTRAVENOUS | Status: AC
  Filled 2015-10-29: qty 10

## 2015-10-29 MED ORDER — ONDANSETRON HCL 4 MG/2ML IJ SOLN: 4.0000 mg | Freq: Four times a day (QID) | INTRAMUSCULAR | Status: DC | PRN

## 2015-10-29 MED ORDER — ROCURONIUM BROMIDE 100 MG/10ML IV SOLN
INTRAVENOUS | Status: DC | PRN
  Administered 2015-10-29: 50 mg via INTRAVENOUS
  Administered 2015-10-29: 10 mg via INTRAVENOUS
  Administered 2015-10-29: 20 mg via INTRAVENOUS
  Administered 2015-10-29: 10 mg via INTRAVENOUS

## 2015-10-29 MED ORDER — OXYCODONE HCL 5 MG/5ML PO SOLN: 5.0000 mg | Freq: Once | ORAL | Status: DC | PRN

## 2015-10-29 MED ORDER — ONDANSETRON HCL 4 MG/2ML IJ SOLN
INTRAMUSCULAR | Status: DC | PRN
  Administered 2015-10-29: 4 mg via INTRAVENOUS

## 2015-10-29 MED ORDER — SUCCINYLCHOLINE CHLORIDE 20 MG/ML IJ SOLN
INTRAMUSCULAR | Status: DC | PRN
  Administered 2015-10-29: 100 mg via INTRAVENOUS
  Administered 2015-10-29: 50 mg via INTRAVENOUS

## 2015-10-29 MED ORDER — PROPOFOL 10 MG/ML IV BOLUS
INTRAVENOUS | Status: DC | PRN
  Administered 2015-10-29: 50 mg via INTRAVENOUS
  Administered 2015-10-29: 150 mg via INTRAVENOUS

## 2015-10-29 MED ORDER — ACETAMINOPHEN 500 MG PO TABS
1000.0000 mg | ORAL_TABLET | Freq: Four times a day (QID) | ORAL | Status: AC
  Administered 2015-10-29 – 2015-10-30 (×4): 1000 mg via ORAL
  Filled 2015-10-29 (×4): qty 2

## 2015-10-29 MED ORDER — KCL IN DEXTROSE-NACL 20-5-0.45 MEQ/L-%-% IV SOLN
INTRAVENOUS | Status: DC
  Administered 2015-10-29: 100 mL/h via INTRAVENOUS
  Administered 2015-10-30 – 2015-10-31 (×3): via INTRAVENOUS
  Administered 2015-10-31: 100 mL/h via INTRAVENOUS
  Administered 2015-11-01 – 2015-11-02 (×3): via INTRAVENOUS
  Filled 2015-10-29 (×11): qty 1000

## 2015-10-29 MED ORDER — LACTATED RINGERS IR SOLN
Status: DC | PRN
  Administered 2015-10-29: 1000 mL

## 2015-10-29 MED ORDER — ONDANSETRON HCL 4 MG/2ML IJ SOLN
INTRAMUSCULAR | Status: AC
  Filled 2015-10-29: qty 2

## 2015-10-29 MED ORDER — HYDROMORPHONE HCL 2 MG/ML IJ SOLN
INTRAMUSCULAR | Status: AC
  Filled 2015-10-29: qty 1

## 2015-10-29 SURGICAL SUPPLY — 96 items
BLADE EXTENDED COATED 6.5IN (ELECTRODE) IMPLANT
CANNULA REDUC XI 12-8 STAPL (CANNULA) ×1
CANNULA REDUC XI 12-8MM STAPL (CANNULA) ×1
CANNULA REDUCER 12-8 DVNC XI (CANNULA) ×1 IMPLANT
CELLS DAT CNTRL 66122 CELL SVR (MISCELLANEOUS) IMPLANT
CLIP LIGATING HEM O LOK PURPLE (MISCELLANEOUS) IMPLANT
CLIP LIGATING HEMOLOK MED (MISCELLANEOUS) IMPLANT
COUNTER NEEDLE 20 DBL MAG RED (NEEDLE) ×3 IMPLANT
COVER MAYO STAND STRL (DRAPES) ×6 IMPLANT
COVER SURGICAL LIGHT HANDLE (MISCELLANEOUS) ×3 IMPLANT
COVER TIP SHEARS 8 DVNC (MISCELLANEOUS) ×1 IMPLANT
COVER TIP SHEARS 8MM DA VINCI (MISCELLANEOUS) ×2
DECANTER SPIKE VIAL GLASS SM (MISCELLANEOUS) ×3 IMPLANT
DEVICE TROCAR PUNCTURE CLOSURE (ENDOMECHANICALS) IMPLANT
DRAPE ARM DVNC X/XI (DISPOSABLE) ×4 IMPLANT
DRAPE COLUMN DVNC XI (DISPOSABLE) ×1 IMPLANT
DRAPE DA VINCI XI ARM (DISPOSABLE) ×8
DRAPE DA VINCI XI COLUMN (DISPOSABLE) ×2
DRAPE SURG IRRIG POUCH 19X23 (DRAPES) ×3 IMPLANT
DRSG OPSITE POSTOP 4X10 (GAUZE/BANDAGES/DRESSINGS) IMPLANT
DRSG OPSITE POSTOP 4X6 (GAUZE/BANDAGES/DRESSINGS) IMPLANT
DRSG OPSITE POSTOP 4X8 (GAUZE/BANDAGES/DRESSINGS) IMPLANT
ELECT PENCIL ROCKER SW 15FT (MISCELLANEOUS) ×6 IMPLANT
ELECT REM PT RETURN 15FT ADLT (MISCELLANEOUS) ×3 IMPLANT
ENDOLOOP SUT PDS II  0 18 (SUTURE)
ENDOLOOP SUT PDS II 0 18 (SUTURE) IMPLANT
EVACUATOR SILICONE 100CC (DRAIN) IMPLANT
GAUZE SPONGE 4X4 12PLY STRL (GAUZE/BANDAGES/DRESSINGS) IMPLANT
GLOVE BIO SURGEON STRL SZ 6 (GLOVE) ×9 IMPLANT
GLOVE INDICATOR 6.5 STRL GRN (GLOVE) ×9 IMPLANT
GOWN STRL REUS W/TWL 2XL LVL3 (GOWN DISPOSABLE) ×9 IMPLANT
GOWN STRL REUS W/TWL XL LVL3 (GOWN DISPOSABLE) ×12 IMPLANT
HOLDER FOLEY CATH W/STRAP (MISCELLANEOUS) ×3 IMPLANT
LEGGING LITHOTOMY PAIR STRL (DRAPES) ×3 IMPLANT
NDL INSUFFLATION 14GA 120MM (NEEDLE) ×1 IMPLANT
NEEDLE INSUFFLATION 14GA 120MM (NEEDLE) ×3 IMPLANT
PACK CARDIOVASCULAR III (CUSTOM PROCEDURE TRAY) ×3 IMPLANT
PACK COLON (CUSTOM PROCEDURE TRAY) ×3 IMPLANT
PEN SKIN MARKING BROAD (MISCELLANEOUS) ×3 IMPLANT
PORT LAP GEL ALEXIS MED 5-9CM (MISCELLANEOUS) IMPLANT
RELOAD STAPLE 45 3.5 WHT DVNC (STAPLE) IMPLANT
RELOAD STAPLE 45 BLU REG DVNC (STAPLE) IMPLANT
RELOAD STAPLE 45 GRN THCK DVNC (STAPLE) IMPLANT
RETRACTOR WND ALEXIS 18 MED (MISCELLANEOUS) IMPLANT
RTRCTR WOUND ALEXIS 18CM MED (MISCELLANEOUS)
SCISSORS LAP 5X35 DISP (ENDOMECHANICALS) ×3 IMPLANT
SEAL CANN UNIV 5-8 DVNC XI (MISCELLANEOUS) ×3 IMPLANT
SEAL XI 5MM-8MM UNIVERSAL (MISCELLANEOUS) ×6
SEALER VESSEL DA VINCI XI (MISCELLANEOUS) ×2
SEALER VESSEL EXT DVNC XI (MISCELLANEOUS) ×1 IMPLANT
SET IRRIG TUBING LAPAROSCOPIC (IRRIGATION / IRRIGATOR) ×3 IMPLANT
SLEEVE XCEL OPT CAN 5 100 (ENDOMECHANICALS) IMPLANT
SOLUTION ELECTROLUBE (MISCELLANEOUS) ×3 IMPLANT
STAPLER 45 BLU RELOAD XI (STAPLE) ×1 IMPLANT
STAPLER 45 BLUE RELOAD XI (STAPLE) ×2
STAPLER 45 GREEN RELOAD XI (STAPLE)
STAPLER 45 GRN RELOAD XI (STAPLE) IMPLANT
STAPLER 45 WHITE RELOAD XI (STAPLE) ×2
STAPLER 45 WHT RELOAD XI (STAPLE) ×1 IMPLANT
STAPLER CANNULA SEAL DVNC XI (STAPLE) ×1 IMPLANT
STAPLER CANNULA SEAL XI (STAPLE) ×2
STAPLER CIRC CVD 29MM 37CM (STAPLE) ×2 IMPLANT
STAPLER SHEATH (SHEATH) ×4
STAPLER SHEATH ENDOWRIST DVNC (SHEATH) ×1 IMPLANT
STAPLER VISISTAT 35W (STAPLE) ×3 IMPLANT
SUT MNCRL AB 4-0 PS2 18 (SUTURE) ×2 IMPLANT
SUT PDS AB 1 CTX 36 (SUTURE) ×6 IMPLANT
SUT PDS AB 1 TP1 96 (SUTURE) IMPLANT
SUT PROLENE 0 CT 2 (SUTURE) ×2 IMPLANT
SUT PROLENE 2 0 KS (SUTURE) ×3 IMPLANT
SUT SILK 2 0 (SUTURE) ×3
SUT SILK 2 0 SH CR/8 (SUTURE) ×3 IMPLANT
SUT SILK 2-0 18XBRD TIE 12 (SUTURE) ×1 IMPLANT
SUT SILK 3 0 (SUTURE) ×3
SUT SILK 3 0 SH CR/8 (SUTURE) ×3 IMPLANT
SUT SILK 3-0 18XBRD TIE 12 (SUTURE) ×1 IMPLANT
SUT V-LOC BARB 180 2/0GR6 GS22 (SUTURE)
SUT VIC AB 2-0 SH 18 (SUTURE) ×3 IMPLANT
SUT VIC AB 2-0 SH 27 (SUTURE) ×3
SUT VIC AB 2-0 SH 27X BRD (SUTURE) ×1 IMPLANT
SUT VIC AB 3-0 SH 18 (SUTURE) ×2 IMPLANT
SUT VIC AB 3-0 SH 8-18 (SUTURE) ×2 IMPLANT
SUT VIC AB 4-0 PS2 27 (SUTURE) ×6 IMPLANT
SUT VICRYL 0 UR6 27IN ABS (SUTURE) ×8 IMPLANT
SUTURE V-LC BRB 180 2/0GR6GS22 (SUTURE) IMPLANT
SYRINGE 10CC LL (SYRINGE) ×3 IMPLANT
SYS LAPSCP GELPORT 120MM (MISCELLANEOUS)
SYSTEM LAPSCP GELPORT 120MM (MISCELLANEOUS) IMPLANT
TOWEL OR 17X26 10 PK STRL BLUE (TOWEL DISPOSABLE) ×3 IMPLANT
TOWEL OR NON WOVEN STRL DISP B (DISPOSABLE) ×3 IMPLANT
TRAY FOLEY W/METER SILVER 14FR (SET/KITS/TRAYS/PACK) ×3 IMPLANT
TRAY FOLEY W/METER SILVER 16FR (SET/KITS/TRAYS/PACK) ×3 IMPLANT
TROCAR BLADELESS OPT 5 100 (ENDOMECHANICALS) ×3 IMPLANT
TUBING CONNECTING 10 (TUBING) IMPLANT
TUBING CONNECTING 10' (TUBING)
TUBING FILTER THERMOFLATOR (ELECTROSURGICAL) ×3 IMPLANT

## 2015-10-29 NOTE — Interval H&P Note (Signed)
History and Physical Interval Note:  10/29/2015 8:43 AM  [name]  has presented today for surgery, with the diagnosis of COLON CANCER  The various methods of treatment have been discussed with the patient and family. After consideration of risks, benefits and other options for treatment, the patient has consented to  Procedure(s): XI ROBOT ASSISTED SIGMOID COLECTOMY (N/A) as a surgical intervention .  The patient's history has been reviewed, patient examined, no change in status, stable for surgery.  I have reviewed the patient's chart and labs.  Questions were answered to the patient's satisfaction.     Brittney [name]

## 2015-10-29 NOTE — Anesthesia Postprocedure Evaluation (Signed)
  Anesthesia Post-op Note  Patient: Brittney [name]  Procedure(s) Performed: Procedure(s): XI ROBOT ASSISTED SIGMOID COLECTOMY (N/A)  Patient Location: PACU  Anesthesia Type:General  Level of Consciousness: awake and alert   Airway and Oxygen Therapy: Patient Spontanous Breathing  Post-op Pain: mild  Post-op Assessment: Post-op Vital signs reviewed              Post-op Vital Signs: Reviewed  Last Vitals:  Filed Vitals:   10/29/15 1736  BP: 145/78  Pulse: 83  Temp: 37.1 C  Resp: 14    Complications: No apparent anesthesia complications

## 2015-10-29 NOTE — Transfer of Care (Signed)
Immediate Anesthesia Transfer of Care Note  Patient: Brittney [name]  Procedure(s) Performed: Procedure(s): XI ROBOT ASSISTED SIGMOID COLECTOMY (N/A)  Patient Location: PACU  Anesthesia Type:General  Level of Consciousness: awake, alert  and oriented  Airway & Oxygen Therapy: Patient Spontanous Breathing and Patient connected to face mask oxygen  Post-op Assessment: Report given to RN and Post -op Vital signs reviewed and stable  Post vital signs: Reviewed and stable  Last Vitals:  Filed Vitals:   10/29/15 0652  BP: 155/92  Pulse: 109  Temp: 36.6 C  Resp: 18    Complications: No apparent anesthesia complications

## 2015-10-29 NOTE — Op Note (Signed)
Robotic Sigmoid Colectomy with splenic flexure takedown  Indications: Patient presented with screening detected adenocarcinoma in the sigmoid colon.  Staging workup was negative. CEA 0.7.    Pre-operative Diagnosis:  cT1-3N0M0  Post-operative Diagnosis: Same + DIFFICULT AIRWAY  Surgeon: Stark Klein   Assistants: Leighton Ruff, MD  Anesthesia: General endotracheal anesthesia and Local anesthesia 1% plain lidocaine, 0.25.% bupivacaine  ASA Class: 2  Procedure Details  The patient was seen in the Holding Room. The risks, benefits, complications, treatment options, and expected outcomes were discussed with the patient. The possibilities of reaction to medication, perforation of viscus, bleeding, recurrent infection, finding a normal colon, the need for additional procedures, failure to diagnose a condition, and creating a complication requiring transfusion or operation were discussed with the patient. The patient was advised of the risk of ostomy. The patient concurred with the proposed plan, giving informed consent. The patient was taken to the operating room, identified, and the procedure verified as robotic sigmoid colectomy.   **Of note, the patient had a difficult intubation, but anesthesia was able to secure the airway and proceed with the case.  **  The patient was placed into lithotomy position.. After induction of a general anesthetic, a Foley catheter was inserted and the abdomen was prepped and draped in standard fashion.  A Time Out was held and the above information confirmed. Local anesthetic was infiltrated in the left subcostal region. The skin was incised with a #11 blade. A Veress needle was placed. Pneumoperitoneum was insufflated to a pressure of 15 mm Hg. The 8 mm robotic trocar was placed.The laparoscope was introduced.   Exploration revealed a normal omentum, colon, small bowel, peritoneum, liver, and stomach. Three additional robotic trocars were then placed  after anesthetizing the skin and peritoneum with Marcaine. The RLQ trocar was a 12 mm one, the others were 8 mm.  A 5 mm assistant port was placed in the right lower quadrant.  The descending colon and splenic flexure were then mobilized with gentle retraction of the colon in a medial direction with mobilization of the peritoneal reflection with scissors.The sigmoid was taken off the lateral abdominal wall. The ureter was identified from medial to lateral. This was retracted posteriorly. Mobilization of this area was complete to expose the retroperitoneum. There was minimal blood loss during this portion of the procedure.    The mobilization continued to include the splenic flexure with the sharp scissors and the vessel sealer. The mesorectal space was identified, but the dissection stopped at the sacral promontory.The colon was cleaned off of mesentery around 10 cm beyond the tumor. The vascular pedicle was identified and stapled.  The proximal rectum was divided with the robotic stapler. The mesentery was divided enough to allow the colon to come down enough to meet the stump.   A pfannenstiel incision was created. The wound protector was placed into the incision. The colon was pulled up through along with the mass. The proximal colon was clamped with a bowel clamp. The cautery was used to divide the colon. Clamps and ties were used to divide the mesentery to remove the specimen.   The mesentery was mobilized to allow the descending colon to reach to the rectal stump. The sizers confirmed that the 33 mm EEA would be acceptable.   The anvil of the stapler was placed in the end of the colon and secured with a 0-0 Prolene pursestring suture. I went to the rectal position and advanced the stapler through the anus. The anvil was coupled  with the stapler. The adjacent tissues were held out of the way while the stapler was closed. Pressure was held, then the stapler was fired. The stapler  was gently removed, and the anastamotic rings were intact. The proctoscope was used to examine the staple line, and no bleeding was seen. The rectum was insufflated, and there were no bubbles seen coming up into the irrigant in the abdomen. The anastamosis was at around 18-20 cm.   The peritoneum was closed with running 0-0 vicryl. The fascia was closed with running #1 PDS x 2. Exparel was placed in the preperitoneal space. The soft tissue was irrigated. The space in the pelvic incision was closed down with 3-0 vicryls. This incision was also closed with 4-0 monocryl subcuticular closure.   Instrument, sponge, and needle counts were correct prior to abdominal closure and at the conclusion of the case.   Findings: DIFFICULT AIRWAY Tattoo in sigmoid colon, no evidence of metastatic disease.    Estimated Blood Loss: <50 mL   Drains: none   Specimens: sigmoid colectomy with anastamotic rings.    Complications: None; patient tolerated the procedure well.   Disposition: PACU - hemodynamically stable.   Condition: stable

## 2015-10-29 NOTE — Anesthesia Procedure Notes (Addendum)
Procedure Name: Intubation Date/Time: 10/29/2015 11:15 AM Performed by: Anne Fu Pre-anesthesia Checklist: Patient identified, Emergency Drugs available, Suction available, Patient being monitored and Timeout performed Patient Re-evaluated:Patient Re-evaluated prior to inductionOxygen Delivery Method: Circle system utilized Preoxygenation: Pre-oxygenation with 100% oxygen Intubation Type: IV induction Ventilation: Mask ventilation without difficulty Laryngoscope Size: Mac and 3 Grade View: Grade IV Tube type: Oral Tube size: 7.5 mm Number of attempts: 1 Airway Equipment and Method: Video-laryngoscopy Placement Confirmation: ETT inserted through vocal cords under direct vision,  positive ETCO2,  CO2 detector and breath sounds checked- equal and bilateral Secured at: 21 cm Tube secured with: Tape Dental Injury: Teeth and Oropharynx as per pre-operative assessment  Difficulty Due To: Difficulty was anticipated, Difficult Airway- due to large tongue, Difficult Airway- due to anterior larynx and Difficult Airway- due to limited oral opening Future Recommendations: Recommend- induction with short-acting agent, and alternative techniques readily available Comments: DL X 1 with MAC 4 noted Grade 4 view converted to glidescope X 2 attempts successful with MAC 3 per MD Ola Spurr, R.  Noted anterior larynx with very limited mouth opening.  Recommendation of short term agents and glidescope.     Pt was easy bag-mask ventilation. DL attempts unsuccessful due to above noted anatomy. Small glidescope blade yielded a grade II view. Larger size 4 glidescope blade with inadequate view due to anterior larynx. Deatra Canter, MD

## 2015-10-29 NOTE — Anesthesia Preprocedure Evaluation (Addendum)
Anesthesia Evaluation  Patient identified by MRN, date of birth, ID band Patient awake    Reviewed: Allergy & Precautions, NPO status , Patient's Chart, lab work & pertinent test results  Airway Mallampati: III  TM Distance: >3 FB Neck ROM: Full    Dental  (+) Dental Advisory Given, Teeth Intact   Pulmonary neg pulmonary ROS,    breath sounds clear to auscultation       Cardiovascular hypertension, Pt. on medications  Rhythm:Regular Rate:Normal     Neuro/Psych negative neurological ROS     GI/Hepatic Neg liver ROS, Colon CA   Endo/Other  negative endocrine ROS  Renal/GU negative Renal ROS     Musculoskeletal negative musculoskeletal ROS (+)   Abdominal   Peds  Hematology negative hematology ROS (+)   Anesthesia Other Findings   Reproductive/Obstetrics                            Lab Results  Component Value Date   WBC 7.9 10/27/2015   HGB 15.2* 10/27/2015   HCT 43.4 10/27/2015   MCV 88.2 10/27/2015   PLT 314 10/27/2015   Lab Results  Component Value Date   CREATININE 0.74 10/27/2015   BUN 12 10/27/2015   NA 141 10/27/2015   K 4.3 10/27/2015   CL 107 10/27/2015   CO2 26 10/27/2015    Anesthesia Physical Anesthesia Plan  ASA: II  Anesthesia Plan: General   Post-op Pain Management:    Induction: Intravenous  Airway Management Planned: Oral ETT  Additional Equipment:   Intra-op Plan:   Post-operative Plan: Extubation in OR  Informed Consent: I have reviewed the patients History and Physical, chart, labs and discussed the procedure including the risks, benefits and alternatives for the proposed anesthesia with the patient or authorized representative who has indicated his/her understanding and acceptance.   Dental advisory given  Plan Discussed with: CRNA  Anesthesia Plan Comments:         Anesthesia Quick Evaluation

## 2015-10-29 NOTE — H&P (Signed)
Brittney [name] Location: [facility] Patient #: [id number] DOB: [birth year] Married / Language: Spanish / Race: Undefined Female   History of Present Illness  The patient is a 49 year old female who presents with colorectal cancer. Patient is a 49 year old female referred by Dr. Ardis Hughs for a new diagnosis of sigmoid colon cancer. She had a screening colonoscopy and was found to have a ulcerated mass in the sigmoid colon. Biopsies are positive for invasive cancer. She has no known history of rectal bleeding, change in bowel habits, or change in bowel caliber. She denies abdominal pain. She has no family history of cancer. She has not had surgery on her abdomen. colonoscopy ENDOSCOPIC IMPRESSION: There was a clearly malignant mass in the sigmoid colon (about 30cm from anal verge). This was non-circumferential and non-obstructive, about 4cm across, ulcerated. The mass was sampled with biopsy forceps and then labeled at the distal edge with submucosal injection of SPOT. The examination was otherwise normal pathology invasive adenocarcinoma.  CT c/a/p IMPRESSION: Short segment wall thickening in the mid sigmoid colon, likely representing site of primary colon carcinoma. No evidence of bowel obstruction. Tiny pericolonic lymph nodes in sigmoid mesocolon measuring up to 5 mm ; early lymph node metastases cannot be excluded. No pathologically enlarged lymph nodes identified. No definite evidence of distant metastatic disease. Tiny indeterminate bilateral pulmonary nodules, largest in left lower lobe measuring 5 mm. Continued attention recommended on follow-up CT. CEA 0.7, normal CBC, K slightly low at 3.2   Other Problems  No pertinent past medical history  Allergies No Known Drug Allergies10/02/2015  Medication History Benicar HCT (40-25MG  Tablet, Oral) Active. Medications Reconciled  Family History  Alcohol Abuse Father, Mother. Breast Cancer Father. Melanoma  Father.  Pregnancy / Birth History Age of menopause [age] Irregular periods    Review of Systems HEENT Not Present- Earache, Hearing Loss, Hoarseness, Nose Bleed, Oral Ulcers, Ringing in the Ears, Seasonal Allergies, Sinus Pain, Sore Throat, Visual Disturbances, Wears glasses/contact lenses and Yellow Eyes. All other systems negative  Vitals Wt Readings from Last 3 Encounters:  10/29/15 69.4 kg (153 lb)  10/27/15 69.4 kg (153 lb)  09/09/15 70.761 kg (156 lb)   Temp Readings from Last 3 Encounters:  10/29/15 97.8 F (36.6 C) Oral  10/27/15 98.3 F (36.8 C) Oral  09/09/15 99.1 F (37.3 C)    BP Readings from Last 3 Encounters:  10/29/15 155/92  10/27/15 177/97  09/09/15 151/90   Pulse Readings from Last 3 Encounters:  10/29/15 109  10/27/15 100  09/09/15 80      Physical Exam General Mental Status-Alert. General Appearance-Consistent with stated age. Hydration-Well hydrated. Voice-Normal.  Head and Neck Head-normocephalic, atraumatic with no lesions or palpable masses. Trachea-midline. Thyroid Gland Characteristics - normal size and consistency.  Eye Eyeball - Bilateral-Extraocular movements intact. Sclera/Conjunctiva - Bilateral-No scleral icterus.  Chest and Lung Exam Chest and lung exam reveals -quiet, even and easy respiratory effort with no use of accessory muscles and on auscultation, normal breath sounds, no adventitious sounds and normal vocal resonance. Inspection Chest Wall - Normal. Back - normal.  Cardiovascular Cardiovascular examination reveals -normal heart sounds, regular rate and rhythm with no murmurs and normal pedal pulses bilaterally.  Abdomen Inspection Inspection of the abdomen reveals - No Hernias. Palpation/Percussion Palpation and Percussion of the abdomen reveal - Soft, Non Tender, No Rebound tenderness, No Rigidity (guarding) and No hepatosplenomegaly. Auscultation Auscultation of the abdomen reveals  - Bowel sounds normal.  Neurologic Neurologic evaluation reveals -alert and  oriented x 3 with no impairment of recent or remote memory. Mental Status-Normal.  Musculoskeletal Global Assessment -Note: no gross deformities.  Normal Exam - Left-Upper Extremity Strength Normal and Lower Extremity Strength Normal. Normal Exam - Right-Upper Extremity Strength Normal and Lower Extremity Strength Normal.  Lymphatic Head & Neck  General Head & Neck Lymphatics: Bilateral - Description - Normal. Axillary  General Axillary Region: Bilateral - Description - Normal. Tenderness - Non Tender. Femoral & Inguinal  Generalized Femoral & Inguinal Lymphatics: Bilateral - Description - No Generalized lymphadenopathy.    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 There is a concern that this is stage III based on the finding of enlarged lymph nodes on CT scan. We will schedule her for a minimally invasive sigmoid colectomy at first available opportunity. I discussed the surgery with the patient as well as the risks of surgery via the interpreter line.  I also gave her a pamphlet about colon cancer surgery that was in [place]. I discussed the risks of surgery including bleeding, infection, damage to adjacent structures, bleeding problems, leak of the anastomosis with the possible risk of colostomy, blood clots, heart or lung complications, or unexpected cancer findings.  She understands and wishes to proceed. She was encouraged to take notes on her colon cancer pamphlet and she had additional questions that she wanted to get answered.  45 min spent in evaluation, examination, counseling, and coordination of care. >50% spent in counseling. Current Plans You are being scheduled for surgery - Our schedulers will call you.  You should hear from our office's scheduling department within 5 working days about the location,  date, and time of surgery. We try to make accommodations for patient's preferences in scheduling surgery, but sometimes the OR schedule or the surgeon's schedule prevents [place] from making those accommodations.  If you have not heard from our office [phone]) in 5 working days, call the office and ask for your surgeon's nurse.  If you have other questions about your diagnosis, plan, or surgery, call the office and ask for your surgeon's nurse.  Pt Education - flb sigmoid colon: discussed with patient and provided information. Pt Education - Pamphlet Given - Colorectal Surgery: discussed with patient and provided information. Pt Education - [facility] Colon Bowel Prep 2015 Miralax/Antibiotics Started Neomycin Sulfate 500MG , 2 (two) Tablet SEE NOTE, #6, 09/22/2015, No Refill. Local Order: TAKE TWO TABLETS AT 2 PM, 3 PM, AND 10 PM THE DAY PRIOR TO SURGERY Started Flagyl 500MG , 2 (two) Tablet SEE NOTE, #6, 09/22/2015, No Refill. Local Order: Take at 2pm, 3pm, and 10pm the day prior to your colon operation   Signed by Stark Klein, MD

## 2015-10-29 NOTE — Plan of Care (Signed)
Problem: Pain Management: Goal: Pain level will decrease Outcome: Progressing Discussed using interpreter.pain scale and use of PCA

## 2015-10-30 LAB — BASIC METABOLIC PANEL
ANION GAP: 6 (ref 5–15)
BUN: 9 mg/dL (ref 6–20)
CHLORIDE: 105 mmol/L (ref 101–111)
CO2: 28 mmol/L (ref 22–32)
CREATININE: 0.87 mg/dL (ref 0.44–1.00)
Calcium: 8.4 mg/dL — ABNORMAL LOW (ref 8.9–10.3)
GFR calc non Af Amer: >60 mL/min (ref >60)
Glucose, Bld: 113 mg/dL — ABNORMAL HIGH (ref 65–99)
Potassium: 3.4 mmol/L — ABNORMAL LOW (ref 3.5–5.1)
Sodium: 139 mmol/L (ref 135–145)

## 2015-10-30 LAB — CBC
HEMATOCRIT: 36.9 % (ref 36.0–46.0)
HEMOGLOBIN: 12.3 g/dL (ref 12.0–15.0)
MCH: 29.4 pg (ref 26.0–34.0)
MCHC: 33.3 g/dL (ref 30.0–36.0)
MCV: 88.3 fL (ref 78.0–100.0)
Platelets: 261 10*3/uL (ref 150–400)
RBC: 4.18 MIL/uL (ref 3.87–5.11)
RDW: 13.7 % (ref 11.5–15.5)
WBC: 10.3 10*3/uL (ref 4.0–10.5)

## 2015-10-30 LAB — MAGNESIUM: Magnesium: 1.6 mg/dL — ABNORMAL LOW (ref 1.7–2.4)

## 2015-10-30 LAB — PHOSPHORUS: PHOSPHORUS: 3.1 mg/dL (ref 2.5–4.6)

## 2015-10-30 MED ORDER — POTASSIUM CHLORIDE 10 MEQ/100ML IV SOLN
10.0000 meq | INTRAVENOUS | Status: AC
  Administered 2015-10-30 (×4): 10 meq via INTRAVENOUS
  Filled 2015-10-30 (×4): qty 100

## 2015-10-30 NOTE — Progress Notes (Signed)
1 Day Post-Op  Subjective: Pain tolerable.  No n/v.  No belching.  No flatus.    Objective: Vital signs in last 24 hours: Temp:  [98 F (36.7 C)-98.8 F (37.1 C)] 98.2 F (36.8 C) (11/10 0946) Pulse Rate:  [65-99] 75 (11/10 1355) Resp:  [14-20] 16 (11/10 1600) BP: (123-174)/(65-91) 146/77 mmHg (11/10 1458) SpO2:  [96 %-100 %] 97 % (11/10 1600)    Intake/Output from previous day: 11/09 0701 - 11/10 0700 In: 3315 [I.V.:3315] Out: 1600 [Urine:1600] Intake/Output this shift: Total I/O In: 540 [P.O.:240; IV Piggyback:300] Out: 1275 [Urine:1275]  General appearance: alert, cooperative and no distress Resp: breathing comfortably Cardio: regular rate and rhythm GI: soft, approp tender near pfannenstiel  Lab Results:   Recent Labs  10/29/15 1812 10/30/15 0547  WBC 13.6* 10.3  HGB 14.1 12.3  HCT 40.7 36.9  PLT 308 261   BMET  Recent Labs  10/29/15 1812 10/30/15 0547  NA  --  139  K  --  3.4*  CL  --  105  CO2  --  28  GLUCOSE  --  113*  BUN  --  9  CREATININE 0.93 0.87  CALCIUM  --  8.4*   PT/INR No results for input(s): LABPROT, INR in the last 72 hours. ABG No results for input(s): PHART, HCO3 in the last 72 hours.  Invalid input(s): PCO2, PO2  Studies/Results: No results found.  Anti-infectives: Anti-infectives    Start     Dose/Rate Route Frequency Ordered Stop   10/29/15 2200  cefoTEtan (CEFOTAN) 2 g in dextrose 5 % 50 mL IVPB     2 g 100 mL/hr over 30 Minutes Intravenous Every 12 hours 10/29/15 1648 10/29/15 2128   10/29/15 0715  cefoTEtan (CEFOTAN) 2 g in dextrose 5 % 50 mL IVPB     2 g 100 mL/hr over 30 Minutes Intravenous On call to O.R. 10/29/15 0715 10/29/15 1137      Assessment/Plan: s/p Procedure(s): XI ROBOT ASSISTED SIGMOID COLECTOMY (N/A) Advance diet d/c foley tomorrow  Ambulate.     LOS: 1 day    [facility] 10/30/2015

## 2015-10-31 LAB — CBC
HEMATOCRIT: 36.6 % (ref 36.0–46.0)
HEMOGLOBIN: 12.5 g/dL (ref 12.0–15.0)
MCH: 31.2 pg (ref 26.0–34.0)
MCHC: 34.2 g/dL (ref 30.0–36.0)
MCV: 91.3 fL (ref 78.0–100.0)
Platelets: 256 10*3/uL (ref 150–400)
RBC: 4.01 MIL/uL (ref 3.87–5.11)
RDW: 14.1 % (ref 11.5–15.5)
WBC: 6.9 10*3/uL (ref 4.0–10.5)

## 2015-10-31 LAB — BASIC METABOLIC PANEL
ANION GAP: 3 — AB (ref 5–15)
BUN: 7 mg/dL (ref 6–20)
CHLORIDE: 107 mmol/L (ref 101–111)
CO2: 28 mmol/L (ref 22–32)
Calcium: 8.4 mg/dL — ABNORMAL LOW (ref 8.9–10.3)
Creatinine, Ser: 0.62 mg/dL (ref 0.44–1.00)
GFR calc non Af Amer: >60 mL/min (ref >60)
Glucose, Bld: 106 mg/dL — ABNORMAL HIGH (ref 65–99)
POTASSIUM: 4.1 mmol/L (ref 3.5–5.1)
Sodium: 138 mmol/L (ref 135–145)

## 2015-10-31 NOTE — Progress Notes (Signed)
Patient ID: Brittney [name], female   DOB: [birth year], 49 y.o.   MRN: [id number] 2 Days Post-Op   Subjective: Doing well.  No n/v.  Passing gas.  No BM yet.    Objective: Vital signs in last 24 hours: Temp:  [98 F (36.7 C)-98.3 F (36.8 C)] 98.2 F (36.8 C) (11/11 0626) Pulse Rate:  [64-78] 64 (11/11 0626) Resp:  [14-20] 17 (11/11 0800) BP: (135-162)/(68-84) 146/81 mmHg (11/11 0626) SpO2:  [97 %-100 %] 97 % (11/11 0800)    Intake/Output from previous day: 11/10 0701 - 11/11 0700 In: 2040 [P.O.:240; I.V.:1500; IV Piggyback:300] Out: 3400 [Urine:3400] Intake/Output this shift:    General appearance: alert, cooperative and no distress Resp: breathing comfortably Cardio: regular rate and rhythm GI: soft, approp tender near pfannenstiel  Lab Results:   Recent Labs  10/30/15 0547 10/31/15 0525  WBC 10.3 6.9  HGB 12.3 12.5  HCT 36.9 36.6  PLT 261 256   BMET  Recent Labs  10/30/15 0547 10/31/15 0525  NA 139 138  K 3.4* 4.1  CL 105 107  CO2 28 28  GLUCOSE 113* 106*  BUN 9 7  CREATININE 0.87 0.62  CALCIUM 8.4* 8.4*   PT/INR No results for input(s): LABPROT, INR in the last 72 hours. ABG No results for input(s): PHART, HCO3 in the last 72 hours.  Invalid input(s): PCO2, PO2  Studies/Results: No results found.  Anti-infectives: Anti-infectives    Start     Dose/Rate Route Frequency Ordered Stop   10/29/15 2200  cefoTEtan (CEFOTAN) 2 g in dextrose 5 % 50 mL IVPB     2 g 100 mL/hr over 30 Minutes Intravenous Every 12 hours 10/29/15 1648 10/29/15 2128   10/29/15 0715  cefoTEtan (CEFOTAN) 2 g in dextrose 5 % 50 mL IVPB     2 g 100 mL/hr over 30 Minutes Intravenous On call to O.R. 10/29/15 0715 10/29/15 1137      Assessment/Plan: s/p Procedure(s): XI ROBOT ASSISTED SIGMOID COLECTOMY (N/A) Full liquids D/c foley Ambulate.     LOS: 2 days    [facility] 10/31/2015

## 2015-11-01 LAB — BASIC METABOLIC PANEL
Anion gap: 6 (ref 5–15)
BUN: 7 mg/dL (ref 6–20)
CHLORIDE: 104 mmol/L (ref 101–111)
CO2: 25 mmol/L (ref 22–32)
Calcium: 8.3 mg/dL — ABNORMAL LOW (ref 8.9–10.3)
Creatinine, Ser: 0.67 mg/dL (ref 0.44–1.00)
GFR calc non Af Amer: >60 mL/min (ref >60)
Glucose, Bld: 111 mg/dL — ABNORMAL HIGH (ref 65–99)
POTASSIUM: 3.6 mmol/L (ref 3.5–5.1)
SODIUM: 135 mmol/L (ref 135–145)

## 2015-11-01 LAB — CBC
HCT: 37 % (ref 36.0–46.0)
HEMOGLOBIN: 12.6 g/dL (ref 12.0–15.0)
MCH: 30.1 pg (ref 26.0–34.0)
MCHC: 34.1 g/dL (ref 30.0–36.0)
MCV: 88.3 fL (ref 78.0–100.0)
Platelets: 269 10*3/uL (ref 150–400)
RBC: 4.19 MIL/uL (ref 3.87–5.11)
RDW: 13.6 % (ref 11.5–15.5)
WBC: 7.2 10*3/uL (ref 4.0–10.5)

## 2015-11-01 NOTE — Progress Notes (Signed)
Patient ID: Brittney [name], female   DOB: 1966-10-24, 49 y.o.   MRN: [id number] [facility] Progress Note:   3 Days Post-Op  Subjective: Mental status is clear.  Incisions look OK and are covered with tagaderm Objective: Vital signs in last 24 hours: Temp:  [98.4 F (36.9 C)-98.9 F (37.2 C)] 98.9 F (37.2 C) (11/12 0531) Pulse Rate:  [78-86] 86 (11/12 0531) Resp:  [14-21] 16 (11/12 0800) BP: (150-179)/(84-88) 168/88 mmHg (11/12 0531) SpO2:  [96 %-98 %] 97 % (11/12 0800) FiO2 (%):  [81 %] 81 % (11/12 0800)  Intake/Output from previous day: 11/11 0701 - 11/12 0700 In: 3680 [P.O.:480; I.V.:3200] Out: 2910 [Urine:2910] Intake/Output this shift:    Physical Exam: Work of breathing is normal.  Flatus + and taking diet.    Lab Results:  Results for orders placed or performed during the hospital encounter of 10/29/15 (from the past 48 hour(s))  CBC     Status: None   Collection Time: 10/31/15  5:25 AM  Result Value Ref Range   WBC 6.9 4.0 - 10.5 K/uL   RBC 4.01 3.87 - 5.11 MIL/uL   Hemoglobin 12.5 12.0 - 15.0 g/dL   HCT 36.6 36.0 - 46.0 %   MCV 91.3 78.0 - 100.0 fL   MCH 31.2 26.0 - 34.0 pg   MCHC 34.2 30.0 - 36.0 g/dL   RDW 14.1 11.5 - 15.5 %   Platelets 256 150 - 400 K/uL  Basic metabolic panel     Status: Abnormal   Collection Time: 10/31/15  5:25 AM  Result Value Ref Range   Sodium 138 135 - 145 mmol/L   Potassium 4.1 3.5 - 5.1 mmol/L    Comment: DELTA CHECK NOTED REPEATED TO VERIFY NO VISIBLE HEMOLYSIS    Chloride 107 101 - 111 mmol/L   CO2 28 22 - 32 mmol/L   Glucose, Bld 106 (H) 65 - 99 mg/dL   BUN 7 6 - 20 mg/dL   Creatinine, Ser 0.62 0.44 - 1.00 mg/dL   Calcium 8.4 (L) 8.9 - 10.3 mg/dL   GFR calc non Af Amer >60 >60 mL/min   GFR calc Af Amer >60 >60 mL/min    Comment: (NOTE) The eGFR has been calculated using the CKD EPI equation. This calculation has not been validated in all clinical situations. eGFR's persistently <60 mL/min signify  possible Chronic Kidney Disease.    Anion gap 3 (L) 5 - 15  CBC     Status: None   Collection Time: 11/01/15  5:30 AM  Result Value Ref Range   WBC 7.2 4.0 - 10.5 K/uL   RBC 4.19 3.87 - 5.11 MIL/uL   Hemoglobin 12.6 12.0 - 15.0 g/dL   HCT 37.0 36.0 - 46.0 %   MCV 88.3 78.0 - 100.0 fL   MCH 30.1 26.0 - 34.0 pg   MCHC 34.1 30.0 - 36.0 g/dL   RDW 13.6 11.5 - 15.5 %   Platelets 269 150 - 400 K/uL  Basic metabolic panel     Status: Abnormal   Collection Time: 11/01/15  5:30 AM  Result Value Ref Range   Sodium 135 135 - 145 mmol/L   Potassium 3.6 3.5 - 5.1 mmol/L   Chloride 104 101 - 111 mmol/L   CO2 25 22 - 32 mmol/L   Glucose, Bld 111 (H) 65 - 99 mg/dL   BUN 7 6 - 20 mg/dL   Creatinine, Ser 0.67 0.44 - 1.00 mg/dL   Calcium 8.3 (  L) 8.9 - 10.3 mg/dL   GFR calc non Af Amer >60 >60 mL/min   GFR calc Af Amer >60 >60 mL/min    Comment: (NOTE) The eGFR has been calculated using the CKD EPI equation. This calculation has not been validated in all clinical situations. eGFR's persistently <60 mL/min signify possible Chronic Kidney Disease.    Anion gap 6 5 - 15    Radiology/Results: No results found.  Anti-infectives: Anti-infectives    Start     Dose/Rate Route Frequency Ordered Stop   10/29/15 2200  cefoTEtan (CEFOTAN) 2 g in dextrose 5 % 50 mL IVPB     2 g 100 mL/hr over 30 Minutes Intravenous Every 12 hours 10/29/15 1648 10/29/15 2128   10/29/15 0715  cefoTEtan (CEFOTAN) 2 g in dextrose 5 % 50 mL IVPB     2 g 100 mL/hr over 30 Minutes Intravenous On call to O.R. 10/29/15 0715 10/29/15 1137      Assessment/Plan: Problem List: Patient Active Problem List   Diagnosis Date Noted  . Cancer of sigmoid colon ([place]) 10/29/2015  . Hypertension, uncontrolled 06/08/2013  . Murmur, cardiac 06/08/2013    Making progress.  Pain controlled.   3 Days Post-Op    LOS: 3 days   Matt B. Hassell Done, MD, [facility] [phone]  beeper [phone]  11/01/2015 9:38 AM

## 2015-11-02 LAB — CBC
HCT: 36.2 % (ref 36.0–46.0)
Hemoglobin: 12.4 g/dL (ref 12.0–15.0)
MCH: 30.5 pg (ref 26.0–34.0)
MCHC: 34.3 g/dL (ref 30.0–36.0)
MCV: 89.2 fL (ref 78.0–100.0)
PLATELETS: 278 10*3/uL (ref 150–400)
RBC: 4.06 MIL/uL (ref 3.87–5.11)
RDW: 13.5 % (ref 11.5–15.5)
WBC: 6.1 10*3/uL (ref 4.0–10.5)

## 2015-11-02 LAB — BASIC METABOLIC PANEL
ANION GAP: 5 (ref 5–15)
BUN: 7 mg/dL (ref 6–20)
CALCIUM: 8.8 mg/dL — AB (ref 8.9–10.3)
CO2: 24 mmol/L (ref 22–32)
CREATININE: 0.62 mg/dL (ref 0.44–1.00)
Chloride: 109 mmol/L (ref 101–111)
GFR calc non Af Amer: >60 mL/min (ref >60)
GLUCOSE: 113 mg/dL — AB (ref 65–99)
Potassium: 3.8 mmol/L (ref 3.5–5.1)
Sodium: 138 mmol/L (ref 135–145)

## 2015-11-02 MED ORDER — HYDROCODONE-ACETAMINOPHEN 5-325 MG PO TABS: 1.0000 | ORAL_TABLET | ORAL | Status: DC | PRN

## 2015-11-02 NOTE — Discharge Summary (Signed)
Physician Discharge Summary  Patient ID: Brittney [name] MRN: [id number] DOB/AGE: Aug 08, 1966 49 y.o.  Admit date: 10/29/2015 Discharge date: 11/02/2015  Admission Diagnoses:  Sigmoid colon cancer  Discharge Diagnoses:  Sigmoid colon cancer: path is still pending   Active Problems:   Cancer of sigmoid colon [facility])   Surgery:  Robotic assisted colectomy  Discharged Condition: improved  Hospital Course:   Had surgery.  Usual postop ileus.  Diet begun and advanced and tolerated.  Incisions OK.  Ready for discharge on Sunday.    Consults: none  Significant Diagnostic Studies: path pending    Discharge Exam: Blood pressure 160/88, pulse 73, temperature 99 F (37.2 C), temperature source Oral, resp. rate 17, height 5\' 1"  (1.549 m), weight 69.4 kg (153 lb), last menstrual period 07/28/2012, SpO2 97 %. Incision oK  Disposition:   Discharge Instructions    Diet - low sodium heart healthy    Complete by:  As directed      Discharge instructions    Complete by:  As directed   May shower and shampoo     Discharge wound care:    Complete by:  As directed   Leave wound sealant in place     Increase activity slowly    Complete by:  As directed             Medication List    TAKE these medications        HYDROcodone-acetaminophen 5-325 MG tablet  Commonly known as:  NORCO  Take 1 tablet by mouth every 4 (four) hours as needed for moderate pain.     olmesartan-hydrochlorothiazide 40-25 MG tablet  Commonly known as:  BENICAR HCT  Take 1 tablet by mouth daily.           Follow-up Information    Follow up with [facility], MD. Schedule an appointment as soon as possible for a visit in 4 weeks.   Specialty:  General Surgery   Contact information:   [address] [place] [address] [phone]       Signed: Pedro Earls 11/02/2015, 9:28 AM

## 2015-11-02 NOTE — Discharge Instructions (Signed)
Laparoscopic Colectomy  Laparoscopic colectomy is surgery to remove part or all of the large intestine (colon). This procedure is used to treat several conditions, including:  · Inflammation and infection of the colon (diverticulitis).  · Tumors or masses in the colon.  · Inflammatory bowel disease, such as Crohn disease or ulcerative colitis. Colectomy is an option when symptoms cannot be controlled with medicines.  · Bleeding from the colon that cannot be controlled by another method.  · Blockage or obstruction of the colon.  LET YOUR HEALTH CARE PROVIDER KNOW ABOUT:  · Any allergies you have.  · All medicines you are taking, including vitamins, herbs, eye drops, creams, and over-the-counter medicines.  · Previous problems you or members of your family have had with the use of anesthetics.  · Any blood disorders you have.  · Previous surgeries you have had.  · Medical conditions you have.  RISKS AND COMPLICATIONS  Generally, this is a safe procedure. However, as with any procedure, complications can occur. Possible complications include:  · Infection.  · Bleeding.  · Damage to other organs.  · Leaking from where the colon was sewn together.  · Future blockage of the small intestines from scar tissue. Another surgery may be needed to repair this.  In some cases, complications such as damage to other organs or excessive bleeding may require the surgeon to convert from a laparoscopic procedure to an open procedure. This involves making a larger incision in the abdomen to perform the procedure.  BEFORE THE PROCEDURE  · Ask your health care provider about changing or stopping any regular medicines.  · You may be prescribed an oral bowel prep. This involves drinking a large amount of medicated liquid, starting the day before your surgery. The liquid will cause you to have multiple loose stools until your stool is almost clear or light green. This cleans out your colon in preparation for the surgery.  · Do not eat or  drink anything else once you have started the bowel prep, unless your health care provider tells you it is safe to do so.  · You may also be given antibiotic pills to clean out your colon of bacteria. Be sure to follow the directions carefully and take the medicine at the correct time.  PROCEDURE   · Small monitors will be put on your body. They are used to check your heart, blood pressure, and oxygen level.  · An IV access tube will be put into one of your veins. Medicine will be able to flow directly into your body through this IV tube.  · You might be given a medicine to help you relax (sedative).  · You will be given a medicine to make you sleep through the procedure (general anesthetic). A breathing tube may be placed into your lungs during the procedure.  · A thin, flexible tube (catheter) will be placed into your bladder to collect urine.  · A tube may be put in through your nose. It is called a nasogastric tube. It is used to remove stomach fluids after surgery until the intestines start working again.  · Your abdomen will be filled with air so that it expands. This gives the surgeon more room to operate and makes your organs easier to see.  · Several small cuts (incisions) are made in your abdomen.  · A thin, lighted tube with a tiny camera on the end (laparoscope) is put through one of the small incisions. The camera on the laparoscope   sends a picture to a TV screen in the operating room. This gives the surgeon a good view inside your abdomen.  · Hollow tubes are put through the other small incisions in your abdomen. The tools needed for the procedure are put through these tubes.  · Clamps or staples are put on both ends of the diseased part of the colon.  · The part of the intestine between the clamps or staples is removed.  · If possible, the ends of the healthy colon that remain will be stitched or stapled together to allow your body to expel waste (stool).  · Sometimes, the remaining colon cannot be  stitched back together. If this is the case, a colostomy is needed. For a colostomy:  ¨ An opening (stoma) to the outside of your body is made through the abdomen.  ¨ The end of the colon is brought to the opening. It is stitched to the skin.  ¨ A bag is attached to the opening. Stool will drain into this bag. The bag is removable.  ¨ The colostomy can be temporary or permanent.  · The incisions from the colectomy are closed with stitches or staples.  AFTER THE PROCEDURE  · You will be monitored closely in a recovery area until you are stable and doing well. You will then be moved to a regular hospital room.  · You will need to receive fluids through an IV tube until your bowel function has returned. This may take 1-3 days. Once your bowels are working again, you will be started on clear liquids and then advanced to solid food as tolerated.  · You will be given pain medicines to control your pain.     This information is not intended to replace advice given to you by your health care provider. Make sure you discuss any questions you have with your health care provider.     Document Released: 02/26/2003 Document Revised: 09/26/2013 Document Reviewed: 07/18/2013  Elsevier Interactive Patient Education ©2016 [employer]

## 2015-11-02 NOTE — Progress Notes (Signed)
Pt's vitals are WNL, tolerating diet and pain is under control. Discussed discharge instructions with both pt and son. All questions and concerns addressed. Discharged to home.

## 2015-11-10 ENCOUNTER — Encounter (HOSPITAL_COMMUNITY): Payer: Self-pay

## 2015-11-18 ENCOUNTER — Telehealth: Payer: Self-pay | Admitting: Oncology

## 2015-11-18 ENCOUNTER — Other Ambulatory Visit: Payer: Self-pay | Admitting: General Surgery

## 2015-11-18 NOTE — Telephone Encounter (Signed)
New patient appt-s/w patient son Truman Hayward and gave appt for 12/01 @ 2 w/Dr. Benay Spice, with an arrive time of 1:30.

## 2015-11-20 ENCOUNTER — Encounter: Payer: Self-pay | Admitting: *Deleted

## 2015-11-20 ENCOUNTER — Telehealth: Payer: Self-pay | Admitting: *Deleted

## 2015-11-20 ENCOUNTER — Encounter: Payer: Self-pay | Admitting: Oncology

## 2015-11-20 ENCOUNTER — Ambulatory Visit (HOSPITAL_BASED_OUTPATIENT_CLINIC_OR_DEPARTMENT_OTHER): Payer: BLUE CROSS/BLUE SHIELD | Admitting: Oncology

## 2015-11-20 ENCOUNTER — Telehealth: Payer: Self-pay | Admitting: Oncology

## 2015-11-20 VITALS — BP 160/90 | HR 118 | Temp 98.6°F | Resp 18 | Ht 61.0 in | Wt 141.7 lb

## 2015-11-20 DIAGNOSIS — R918 Other nonspecific abnormal finding of lung field: Secondary | ICD-10-CM | POA: Diagnosis not present

## 2015-11-20 DIAGNOSIS — C187 Malignant neoplasm of sigmoid colon: Secondary | ICD-10-CM | POA: Diagnosis not present

## 2015-11-20 DIAGNOSIS — I10 Essential (primary) hypertension: Secondary | ICD-10-CM

## 2015-11-20 NOTE — Progress Notes (Signed)
Oncology Nurse Navigator Documentation  Oncology Nurse Navigator Flowsheets 11/20/2015  Referral date to RadOnc/MedOnc 11/18/2015  Navigator Encounter Type Initial MedOnc  Patient Visit Type Medonc  Treatment Phase Treatment planning  Barriers/Navigation Needs Family concerns;Education/Communication  Education Understanding Cancer/ Treatment Options; Symptom Management;Newly Diagnosed Cancer Education;Preparing for Upcoming Surgery/ Treatment  Interventions Other;Coordination of Care;Education Method  Education Method Verbal;Written;Teach-back  Support Groups/Services GI;Other-ACS Personal Health Manager  Time Spent with Patient 60  Met with patient and health system interpreter during new patient visit. Explained the role of the GI Nurse Navigator and provided New Patient Packet with information on: 1. Colon cancer (English & Spanish) 2. Support groups 3. Advanced Directives 4. Fall Safety Plan Answered questions, reviewed current treatment plan using TEACH back and provided emotional support. Provided copy of current treatment plan. Forwarded her [employer] Disability forms to managed care to be completed to extend her disability from 12/05/15 out to 01/06/16. Message to Dr. Barry Dienes requesting [facility] placement week of 11/24/15 and for office to mail patient copy of her disability forms from their office for her records. She will bring a family member to the chemo class. Instructed her if she needs me, call the main # and leave her name and DOB for nurse navigator and she will be called back with language line service. She understands and agrees to this plan. Denies any barriers to her care at this time-no problem with transportation, has insurance and strong family support. Instructed her to tell her brothers and sister to be sure they have had colonoscopy and she needs her children to have colonoscopy at age 87.  Merceda Elks, RN, BSN GI Oncology [place]

## 2015-11-20 NOTE — Telephone Encounter (Signed)
per pof to sch pt appt-gave pt copy of avs-sent MAW email to sch trmt-to call pt if anything changes-pt aware

## 2015-11-20 NOTE — Progress Notes (Signed)
Brittney [name]   Referring MD: Brittney [name] 49 y.o.  01-20-1966    Reason for Referral: Colon cancer   HPI: ( she is accompanied by a Spanish interpreter) Brittney [name] was referred to Dr. Ardis [name] for a screening colonoscopy 09/09/2015. A mass was noted in the sigmoid colon at 30 cm from the anal verge. The mass was nonobstructing. The mass was biopsied and tattooed. The exam was otherwise normal. The pathology revealed invasive adenocarcinoma.  She is referred for CTs of the chest, abdomen, and pelvis on 09/11/2015. A few tiny noncalcified pulmonary nodules were noted bilaterally, the largest measured 5 mm in the left lower lobe. No liver masses identified. Short segment wall thickening was noted in the mid sigmoid colon with tiny subset meter lymph nodes in the sigmoid mesocolon. No pathologically enlarged lymph nodes.  She was referred to Dr. Barry [name] and was taken to the operating room on 10/29/2015 for a robotic sigmoid colectomy. The peritoneum and liver appeared normal.  The pathology ([id number]) confirmed a moderately differentiated adenocarcinoma of the sigmoid colon. Tumor invaded through the muscular propria into pericolonic tissue. The resection margins are negative. Metastatic adenocarcinoma was identified in 1 of 16 lymph nodes. The vascular invasion was identified. No perineural invasion. No tumor deposit. There was no loss of mismatch repair protein expression. The tumor returned MSI-stable.  She reports beginning a diet and the bowels are functioning.  Past Medical History  Diagnosis Date  . Hypertension   . Cancer (HCC)-sigmoid colon (T3 N1)   10/29/2015     colon cancer  . History of gallstones     .  G3 P3  Past Surgical History  Procedure Laterality Date  . Breast mass excision Left 2007    benign   . Breast biopsy    . Cholecystectomy      pt states just had the gallstones removed     Medications:  Reviewed  Allergies: No Known Allergies  Family history: No family history of cancer. 3 brothers and one sister.  Social History:   She lives with her husband and children in [place]. She works as a Glass blower/designer. She does not use tobacco or alcohol. No transfusion history. No risk factor for hepatitis.   ROS:   Positives include: None  A complete ROS was otherwise negative.  Physical Exam:   HEENT: Oropharynx without visible mass, neck without mass Lungs: Clear bilaterally Cardiac: Regular rate and rhythm Abdomen: No hepatosplenomegaly, no mass, healed surgical incisions  Vascular: No leg edema Lymph nodes: No cervical, supra-clavicular, axillary, or inguinal nodes Neurologic: Alert and oriented, the motor exam appears intact in the upper and lower extremities Skin: No rash, surgical scar at the upper outer left breast Musculoskeletal: No spine tenderness   LAB:  CBC  Lab Results  Component Value Date   WBC 6.1 11/02/2015   HGB 12.4 11/02/2015   HCT 36.2 11/02/2015   MCV 89.2 11/02/2015   PLT 278 11/02/2015   NEUTROABS 5.8 10/27/2015     CMP      Component Value Date/Time   NA 138 11/02/2015 0548   K 3.8 11/02/2015 0548   CL 109 11/02/2015 0548   CO2 24 11/02/2015 0548   GLUCOSE 113* 11/02/2015 0548   BUN 7 11/02/2015 0548   CREATININE 0.62 11/02/2015 0548   CALCIUM 8.8* 11/02/2015 0548   PROT 7.3 09/09/2015 0950   ALBUMIN 4.4 09/09/2015 0950   AST 19 09/09/2015 0950  ALT 30 09/09/2015 0950   ALKPHOS 80 09/09/2015 0950   BILITOT 0.5 09/09/2015 0950   GFRNONAA >60 11/02/2015 0548   GFRAA >60 11/02/2015 0548    Lab Results  Component Value Date   CEA 0.7 09/09/2015    Imaging: As per history of present illness, images reviewed   Assessment/Plan:   1. 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   Hypertension  3.   G3 P3  4.   Indeterminate bilateral pulmonary nodules on the staging chest CT 09/11/2015   Disposition:   [name] has been diagnosed with stage III colon cancer. I discussed the prognosis and adjuvant treatment options with her. We reviewed the details of the surgical pathology report. I explained the benefit associated with adjuvant 5-fluorouracil/oxaliplatin-based chemotherapy. She has a significant chance of developing recurrent colon cancer over the next several years. She is in good health. I recommend adjuvant chemotherapy. We discussed the specifics of the FOLFOX and CAPOX chemotherapy regimens. She decided on FOLFOX chemotherapy.  I reviewed the potential toxicities associated with the FOLFOX regimen including the chance for nausea/vomiting, and the excised, diarrhea, alopecia, and hematologic toxicity. We discussed the rash, hyperpigmentation, some sensitivity, and hand/foot syndrome associated with 5 years of. We reviewed the various types of neuropathy associated with oxaliplatin. She agrees to proceed. She will attend a chemotherapy teaching class.  The plan is to begin a first cycle of adjuvant FOLFOX on 12/03/2015. She will return for an office visit and cycle 2 chemotherapy 12/17/2015.  We will refer her to Dr. Barry [name] for placement of a Port-A-Cath.   She does not appear to have hereditary non-polyposis colon cancer syndrome. However, her family members are at increased risk of developing colon cancer and should receive appropriate screening.  Approximately 50 minutes were spent with the patient today. The majority of the time was used for counseling and coordination of care.  [place], [place] 11/20/2015, 1:55 PM

## 2015-11-20 NOTE — Telephone Encounter (Signed)
Per staff message and POF I have scheduled appts. Advised scheduler of appts. JMW  

## 2015-11-21 ENCOUNTER — Other Ambulatory Visit: Payer: Self-pay | Admitting: *Deleted

## 2015-11-21 MED ORDER — LIDOCAINE-PRILOCAINE 2.5-2.5 % EX CREA: TOPICAL_CREAM | CUTANEOUS | Status: DC

## 2015-11-21 MED ORDER — PROCHLORPERAZINE MALEATE 10 MG PO TABS: 10.0000 mg | ORAL_TABLET | Freq: Four times a day (QID) | ORAL | Status: DC | PRN

## 2015-11-24 ENCOUNTER — Other Ambulatory Visit: Payer: Self-pay | Admitting: *Deleted

## 2015-11-24 DIAGNOSIS — C187 Malignant neoplasm of sigmoid colon: Secondary | ICD-10-CM

## 2015-11-25 ENCOUNTER — Encounter (HOSPITAL_BASED_OUTPATIENT_CLINIC_OR_DEPARTMENT_OTHER): Payer: Self-pay | Admitting: *Deleted

## 2015-11-25 ENCOUNTER — Telehealth: Payer: Self-pay | Admitting: Oncology

## 2015-11-25 ENCOUNTER — Other Ambulatory Visit: Payer: Self-pay | Admitting: *Deleted

## 2015-11-25 NOTE — Telephone Encounter (Signed)
Per 12/6 pof move lab/ched from 12/7 to 12/12 due to port placement on 12/7. Left message for patient via [place] re change with date/time for 12/12. Schedule mailed.

## 2015-11-25 NOTE — Progress Notes (Signed)
Spoke with son Brittney [name] who speaks fluent [place]. States [name] is healing well after surgery on 10-29-15 and is aware of PAC placement tomorrow. Patient will be NPO after midnight and take her Benicar with a sip of water and arrive here at [facility].

## 2015-11-25 NOTE — Telephone Encounter (Signed)
per email to move pt to inter slot on 12/7 @12 -cld & spoke to Jose(son)and adv of time & date of appt

## 2015-11-26 ENCOUNTER — Encounter (HOSPITAL_BASED_OUTPATIENT_CLINIC_OR_DEPARTMENT_OTHER): Payer: Self-pay | Admitting: Certified Registered"

## 2015-11-26 ENCOUNTER — Telehealth: Payer: Self-pay | Admitting: Oncology

## 2015-11-26 ENCOUNTER — Other Ambulatory Visit: Payer: BLUE CROSS/BLUE SHIELD

## 2015-11-26 ENCOUNTER — Ambulatory Visit (HOSPITAL_COMMUNITY): Payer: BLUE CROSS/BLUE SHIELD

## 2015-11-26 ENCOUNTER — Ambulatory Visit (HOSPITAL_BASED_OUTPATIENT_CLINIC_OR_DEPARTMENT_OTHER): Payer: BLUE CROSS/BLUE SHIELD | Admitting: Anesthesiology

## 2015-11-26 ENCOUNTER — Encounter (HOSPITAL_BASED_OUTPATIENT_CLINIC_OR_DEPARTMENT_OTHER)
Admission: RE | Disposition: A | Discharge status: Home / Self Care | Payer: Self-pay | Source: Ambulatory Visit | Attending: General Surgery

## 2015-11-26 ENCOUNTER — Ambulatory Visit (HOSPITAL_BASED_OUTPATIENT_CLINIC_OR_DEPARTMENT_OTHER)
Admission: RE | Admit: 2015-11-26 | Discharge: 2015-11-26 | Disposition: A | Discharge status: Home / Self Care | Payer: BLUE CROSS/BLUE SHIELD | Source: Ambulatory Visit | Attending: General Surgery | Admitting: General Surgery

## 2015-11-26 DIAGNOSIS — C189 Malignant neoplasm of colon, unspecified: Secondary | ICD-10-CM | POA: Insufficient documentation

## 2015-11-26 DIAGNOSIS — Z452 Encounter for adjustment and management of vascular access device: Secondary | ICD-10-CM | POA: Insufficient documentation

## 2015-11-26 DIAGNOSIS — C779 Secondary and unspecified malignant neoplasm of lymph node, unspecified: Secondary | ICD-10-CM | POA: Diagnosis not present

## 2015-11-26 DIAGNOSIS — I10 Essential (primary) hypertension: Secondary | ICD-10-CM | POA: Insufficient documentation

## 2015-11-26 DIAGNOSIS — Z95828 Presence of other vascular implants and grafts: Secondary | ICD-10-CM

## 2015-11-26 HISTORY — PX: PORTACATH PLACEMENT: SHX2246

## 2015-11-26 SURGERY — INSERTION, TUNNELED CENTRAL VENOUS DEVICE, WITH PORT
Anesthesia: General | Site: Chest | Laterality: Left

## 2015-11-26 MED ORDER — PROMETHAZINE HCL 25 MG/ML IJ SOLN: 6.2500 mg | INTRAMUSCULAR | Status: DC | PRN

## 2015-11-26 MED ORDER — BUPIVACAINE-EPINEPHRINE 0.5% -1:200000 IJ SOLN
INTRAMUSCULAR | Status: DC | PRN
  Administered 2015-11-26: 13 mL

## 2015-11-26 MED ORDER — LIDOCAINE HCL (CARDIAC) 20 MG/ML IV SOLN
INTRAVENOUS | Status: AC
  Filled 2015-11-26: qty 5

## 2015-11-26 MED ORDER — CEFAZOLIN SODIUM-DEXTROSE 2-3 GM-% IV SOLR
INTRAVENOUS | Status: AC
  Filled 2015-11-26: qty 50

## 2015-11-26 MED ORDER — MIDAZOLAM HCL 5 MG/5ML IJ SOLN
INTRAMUSCULAR | Status: DC | PRN
  Administered 2015-11-26: 2 mg via INTRAVENOUS

## 2015-11-26 MED ORDER — DEXAMETHASONE SODIUM PHOSPHATE 10 MG/ML IJ SOLN
INTRAMUSCULAR | Status: AC
  Filled 2015-11-26: qty 1

## 2015-11-26 MED ORDER — SODIUM CHLORIDE 0.9 % IJ SOLN: 3.0000 mL | Freq: Two times a day (BID) | INTRAMUSCULAR | Status: DC

## 2015-11-26 MED ORDER — OXYCODONE HCL 5 MG PO TABS: 5.0000 mg | ORAL_TABLET | ORAL | Status: DC | PRN

## 2015-11-26 MED ORDER — ACETAMINOPHEN 650 MG RE SUPP: 650.0000 mg | RECTAL | Status: DC | PRN

## 2015-11-26 MED ORDER — EPHEDRINE SULFATE 50 MG/ML IJ SOLN
INTRAMUSCULAR | Status: DC | PRN
  Administered 2015-11-26: 10 mg via INTRAVENOUS

## 2015-11-26 MED ORDER — LACTATED RINGERS IV SOLN
INTRAVENOUS | Status: DC | PRN
  Administered 2015-11-26 (×2): via INTRAVENOUS

## 2015-11-26 MED ORDER — HEPARIN SOD (PORK) LOCK FLUSH 100 UNIT/ML IV SOLN
INTRAVENOUS | Status: AC
  Filled 2015-11-26: qty 5

## 2015-11-26 MED ORDER — MIDAZOLAM HCL 2 MG/2ML IJ SOLN
INTRAMUSCULAR | Status: AC
  Filled 2015-11-26: qty 2

## 2015-11-26 MED ORDER — ONDANSETRON HCL 4 MG/2ML IJ SOLN
INTRAMUSCULAR | Status: AC
  Filled 2015-11-26: qty 2

## 2015-11-26 MED ORDER — SODIUM CHLORIDE 0.9 % IJ SOLN: 3.0000 mL | INTRAMUSCULAR | Status: DC | PRN

## 2015-11-26 MED ORDER — FENTANYL CITRATE (PF) 100 MCG/2ML IJ SOLN
INTRAMUSCULAR | Status: AC
  Filled 2015-11-26: qty 2

## 2015-11-26 MED ORDER — SCOPOLAMINE 1 MG/3DAYS TD PT72: 1.0000 | MEDICATED_PATCH | Freq: Once | TRANSDERMAL | Status: DC

## 2015-11-26 MED ORDER — PROPOFOL 10 MG/ML IV BOLUS
INTRAVENOUS | Status: DC | PRN
  Administered 2015-11-26: 170 mg via INTRAVENOUS

## 2015-11-26 MED ORDER — DEXAMETHASONE SODIUM PHOSPHATE 4 MG/ML IJ SOLN
INTRAMUSCULAR | Status: DC | PRN
  Administered 2015-11-26: 10 mg via INTRAVENOUS

## 2015-11-26 MED ORDER — FENTANYL CITRATE (PF) 100 MCG/2ML IJ SOLN: 50.0000 ug | INTRAMUSCULAR | Status: DC | PRN

## 2015-11-26 MED ORDER — ONDANSETRON HCL 4 MG/2ML IJ SOLN
INTRAMUSCULAR | Status: DC | PRN
  Administered 2015-11-26: 4 mg via INTRAVENOUS

## 2015-11-26 MED ORDER — OXYCODONE HCL 5 MG/5ML PO SOLN: 5.0000 mg | Freq: Once | ORAL | Status: DC | PRN

## 2015-11-26 MED ORDER — OXYCODONE HCL 5 MG PO TABS: 5.0000 mg | ORAL_TABLET | Freq: Once | ORAL | Status: DC | PRN

## 2015-11-26 MED ORDER — HEPARIN (PORCINE) IN NACL 2-0.9 UNIT/ML-% IJ SOLN
INTRAMUSCULAR | Status: DC | PRN
  Administered 2015-11-26: 1

## 2015-11-26 MED ORDER — LIDOCAINE HCL (CARDIAC) 20 MG/ML IV SOLN
INTRAVENOUS | Status: DC | PRN
  Administered 2015-11-26: 60 mg via INTRAVENOUS

## 2015-11-26 MED ORDER — CEFAZOLIN SODIUM-DEXTROSE 2-3 GM-% IV SOLR
2.0000 g | INTRAVENOUS | Status: AC
  Administered 2015-11-26: 2 g via INTRAVENOUS

## 2015-11-26 MED ORDER — MEPERIDINE HCL 25 MG/ML IJ SOLN: 6.2500 mg | INTRAMUSCULAR | Status: DC | PRN

## 2015-11-26 MED ORDER — SODIUM CHLORIDE 0.9 % IV SOLN: 250.0000 mL | INTRAVENOUS | Status: DC | PRN

## 2015-11-26 MED ORDER — LACTATED RINGERS IV SOLN
INTRAVENOUS | Status: DC
  Administered 2015-11-26: 10:00:00 via INTRAVENOUS

## 2015-11-26 MED ORDER — HEPARIN SOD (PORK) LOCK FLUSH 100 UNIT/ML IV SOLN
INTRAVENOUS | Status: DC | PRN
  Administered 2015-11-26: 500 [IU]

## 2015-11-26 MED ORDER — GLYCOPYRROLATE 0.2 MG/ML IJ SOLN: 0.2000 mg | Freq: Once | INTRAMUSCULAR | Status: DC | PRN

## 2015-11-26 MED ORDER — ACETAMINOPHEN 325 MG PO TABS: 650.0000 mg | ORAL_TABLET | ORAL | Status: DC | PRN

## 2015-11-26 MED ORDER — MIDAZOLAM HCL 2 MG/2ML IJ SOLN: 1.0000 mg | INTRAMUSCULAR | Status: DC | PRN

## 2015-11-26 MED ORDER — HYDROMORPHONE HCL 1 MG/ML IJ SOLN: 0.2500 mg | INTRAMUSCULAR | Status: DC | PRN

## 2015-11-26 MED ORDER — FENTANYL CITRATE (PF) 100 MCG/2ML IJ SOLN
INTRAMUSCULAR | Status: DC | PRN
  Administered 2015-11-26 (×2): 50 ug via INTRAVENOUS

## 2015-11-26 MED ORDER — LIDOCAINE-EPINEPHRINE (PF) 1 %-1:200000 IJ SOLN
INTRAMUSCULAR | Status: AC
  Filled 2015-11-26: qty 30

## 2015-11-26 SURGICAL SUPPLY — 40 items
BAG DECANTER FOR FLEXI CONT (MISCELLANEOUS) ×3 IMPLANT
BLADE HEX COATED 2.75 (ELECTRODE) ×3 IMPLANT
BLADE SURG 11 STRL SS (BLADE) ×3 IMPLANT
BLADE SURG 15 STRL LF DISP TIS (BLADE) ×1 IMPLANT
BLADE SURG 15 STRL SS (BLADE) ×3
CHLORAPREP W/TINT 26ML (MISCELLANEOUS) ×3 IMPLANT
COVER BACK TABLE 60X90IN (DRAPES) ×3 IMPLANT
COVER MAYO STAND STRL (DRAPES) ×3 IMPLANT
DECANTER SPIKE VIAL GLASS SM (MISCELLANEOUS) IMPLANT
DRAPE C-ARM 42X72 X-RAY (DRAPES) ×3 IMPLANT
DRAPE LAPAROTOMY TRNSV 102X78 (DRAPE) ×3 IMPLANT
DRAPE UTILITY XL STRL (DRAPES) ×3 IMPLANT
DRSG TEGADERM 4X4.75 (GAUZE/BANDAGES/DRESSINGS) IMPLANT
ELECT REM PT RETURN 9FT ADLT (ELECTROSURGICAL) ×3
ELECTRODE REM PT RTRN 9FT ADLT (ELECTROSURGICAL) ×1 IMPLANT
GLOVE BIO SURGEON STRL SZ 6 (GLOVE) ×3 IMPLANT
GLOVE BIOGEL PI IND STRL 6.5 (GLOVE) ×1 IMPLANT
GLOVE BIOGEL PI INDICATOR 6.5 (GLOVE) ×2
GLOVE SURG SS PI 7.0 STRL IVOR (GLOVE) ×2 IMPLANT
GOWN STRL REUS W/ TWL LRG LVL3 (GOWN DISPOSABLE) ×1 IMPLANT
GOWN STRL REUS W/TWL 2XL LVL3 (GOWN DISPOSABLE) ×3 IMPLANT
GOWN STRL REUS W/TWL LRG LVL3 (GOWN DISPOSABLE) ×3
KIT PORT POWER 8FR ISP CVUE (Catheter) ×2 IMPLANT
LIQUID BAND (GAUZE/BANDAGES/DRESSINGS) ×3 IMPLANT
NDL HYPO 25X1 1.5 SAFETY (NEEDLE) ×1 IMPLANT
NEEDLE HYPO 25X1 1.5 SAFETY (NEEDLE) ×3 IMPLANT
PACK BASIN DAY SURGERY FS (CUSTOM PROCEDURE TRAY) ×3 IMPLANT
PENCIL BUTTON HOLSTER BLD 10FT (ELECTRODE) ×3 IMPLANT
SLEEVE SCD COMPRESS KNEE MED (MISCELLANEOUS) ×3 IMPLANT
SPONGE GAUZE 4X4 12PLY STER LF (GAUZE/BANDAGES/DRESSINGS) IMPLANT
SUT MNCRL AB 4-0 PS2 18 (SUTURE) ×3 IMPLANT
SUT PROLENE 2 0 SH DA (SUTURE) ×4 IMPLANT
SUT VIC AB 3-0 SH 27 (SUTURE) ×3
SUT VIC AB 3-0 SH 27X BRD (SUTURE) ×1 IMPLANT
SUT VICRYL 3-0 CR8 SH (SUTURE) IMPLANT
SYR 5ML LUER SLIP (SYRINGE) ×3 IMPLANT
SYR CONTROL 10ML LL (SYRINGE) ×3 IMPLANT
SYRINGE 10CC LL (SYRINGE) ×3 IMPLANT
TOWEL OR 17X24 6PK STRL BLUE (TOWEL DISPOSABLE) ×3 IMPLANT
TOWEL OR NON WOVEN STRL DISP B (DISPOSABLE) ×3 IMPLANT

## 2015-11-26 NOTE — Anesthesia Preprocedure Evaluation (Signed)
Anesthesia Evaluation  Patient identified by MRN, date of birth, ID band Patient awake    Reviewed: Allergy & Precautions, NPO status , Patient's Chart, lab work & pertinent test results  Airway Mallampati: III  TM Distance: >3 FB Neck ROM: Full    Dental  (+) Dental Advisory Given, Teeth Intact   Pulmonary neg pulmonary ROS,    breath sounds clear to auscultation       Cardiovascular hypertension, Pt. on medications + Valvular Problems/Murmurs  Rhythm:Regular Rate:Normal     Neuro/Psych negative neurological ROS     GI/Hepatic Neg liver ROS, Colon CA   Endo/Other  negative endocrine ROS  Renal/GU negative Renal ROS     Musculoskeletal negative musculoskeletal ROS (+)   Abdominal   Peds  Hematology negative hematology ROS (+)   Anesthesia Other Findings   Reproductive/Obstetrics                             Lab Results  Component Value Date   WBC 6.1 11/02/2015   HGB 12.4 11/02/2015   HCT 36.2 11/02/2015   MCV 89.2 11/02/2015   PLT 278 11/02/2015   Lab Results  Component Value Date   CREATININE 0.62 11/02/2015   BUN 7 11/02/2015   NA 138 11/02/2015   K 3.8 11/02/2015   CL 109 11/02/2015   CO2 24 11/02/2015    Anesthesia Physical  Anesthesia Plan  ASA: II  Anesthesia Plan: General   Post-op Pain Management:    Induction: Intravenous  Airway Management Planned: LMA  Additional Equipment:   Intra-op Plan:   Post-operative Plan: Extubation in OR  Informed Consent: I have reviewed the patients History and Physical, chart, labs and discussed the procedure including the risks, benefits and alternatives for the proposed anesthesia with the patient or authorized representative who has indicated his/her understanding and acceptance.   Dental advisory given  Plan Discussed with: CRNA  Anesthesia Plan Comments:         Anesthesia Quick Evaluation                     Anesthesia Evaluation  Patient identified by MRN, date of birth, ID band Patient awake    Reviewed: Allergy & Precautions, NPO status , Patient's Chart, lab work & pertinent test results  Airway Mallampati: III  TM Distance: >3 FB Neck ROM: Full    Dental  (+) Dental Advisory Given, Teeth Intact   Pulmonary neg pulmonary ROS,    breath sounds clear to auscultation       Cardiovascular hypertension, Pt. on medications  Rhythm:Regular Rate:Normal     Neuro/Psych negative neurological ROS     GI/Hepatic Neg liver ROS, Colon CA   Endo/Other  negative endocrine ROS  Renal/GU negative Renal ROS     Musculoskeletal negative musculoskeletal ROS (+)   Abdominal   Peds  Hematology negative hematology ROS (+)   Anesthesia Other Findings   Reproductive/Obstetrics                            Lab Results  Component Value Date   WBC 6.1 11/02/2015   HGB 12.4 11/02/2015   HCT 36.2 11/02/2015   MCV 89.2 11/02/2015   PLT 278 11/02/2015   Lab Results  Component Value Date   CREATININE 0.62 11/02/2015   BUN 7 11/02/2015   NA 138 11/02/2015   K 3.8 11/02/2015  CL 109 11/02/2015   CO2 24 11/02/2015    Anesthesia Physical Anesthesia Plan  ASA: II  Anesthesia Plan: General   Post-op Pain Management:    Induction: Intravenous  Airway Management Planned: Oral ETT  Additional Equipment:   Intra-op Plan:   Post-operative Plan: Extubation in OR  Informed Consent: I have reviewed the patients History and Physical, chart, labs and discussed the procedure including the risks, benefits and alternatives for the proposed anesthesia with the patient or authorized representative who has indicated his/her understanding and acceptance.   Dental advisory given  Plan Discussed with: CRNA  Anesthesia Plan Comments:         Anesthesia Quick Evaluation

## 2015-11-26 NOTE — H&P (View-Only) (Signed)
Patient ID: Brittney [name], female   DOB: Aug 31, 1966, 49 y.o.   MRN: [id number] 2 Days Post-Op   Subjective: Doing well.  No n/v.  Passing gas.  No BM yet.    Objective: Vital signs in last 24 hours: Temp:  [98 F (36.7 C)-98.3 F (36.8 C)] 98.2 F (36.8 C) (11/11 0626) Pulse Rate:  [64-78] 64 (11/11 0626) Resp:  [14-20] 17 (11/11 0800) BP: (135-162)/(68-84) 146/81 mmHg (11/11 0626) SpO2:  [97 %-100 %] 97 % (11/11 0800)    Intake/Output from previous day: 11/10 0701 - 11/11 0700 In: 2040 [P.O.:240; I.V.:1500; IV Piggyback:300] Out: 3400 [Urine:3400] Intake/Output this shift:    General appearance: alert, cooperative and no distress Resp: breathing comfortably Cardio: regular rate and rhythm GI: soft, approp tender near pfannenstiel  Lab Results:   Recent Labs  10/30/15 0547 10/31/15 0525  WBC 10.3 6.9  HGB 12.3 12.5  HCT 36.9 36.6  PLT 261 256   BMET  Recent Labs  10/30/15 0547 10/31/15 0525  NA 139 138  K 3.4* 4.1  CL 105 107  CO2 28 28  GLUCOSE 113* 106*  BUN 9 7  CREATININE 0.87 0.62  CALCIUM 8.4* 8.4*   PT/INR No results for input(s): LABPROT, INR in the last 72 hours. ABG No results for input(s): PHART, HCO3 in the last 72 hours.  Invalid input(s): PCO2, PO2  Studies/Results: No results found.  Anti-infectives: Anti-infectives    Start     Dose/Rate Route Frequency Ordered Stop   10/29/15 2200  cefoTEtan (CEFOTAN) 2 g in dextrose 5 % 50 mL IVPB     2 g 100 mL/hr over 30 Minutes Intravenous Every 12 hours 10/29/15 1648 10/29/15 2128   10/29/15 0715  cefoTEtan (CEFOTAN) 2 g in dextrose 5 % 50 mL IVPB     2 g 100 mL/hr over 30 Minutes Intravenous On call to O.R. 10/29/15 0715 10/29/15 1137      Assessment/Plan: s/p Procedure(s): XI ROBOT ASSISTED SIGMOID COLECTOMY (N/A) Full liquids D/c foley Ambulate.     LOS: 2 days    [facility] 10/31/2015

## 2015-11-26 NOTE — Discharge Instructions (Addendum)
[facility] °Office Phone Number [phone] ° ° POST OP INSTRUCTIONS ° °Always review your discharge instruction sheet given to you by the facility where your surgery was performed. ° °IF YOU HAVE DISABILITY OR FAMILY LEAVE FORMS, YOU MUST BRING THEM TO THE OFFICE FOR PROCESSING.  DO NOT GIVE THEM TO YOUR DOCTOR. ° °1. A prescription for pain medication may be given to you upon discharge.  Take your pain medication as prescribed, if needed.  If narcotic pain medicine is not needed, then you may take acetaminophen (Tylenol) or ibuprofen (Advil) as needed. °2. Take your usually prescribed medications unless otherwise directed °3. If you need a refill on your pain medication, please contact your pharmacy.  They will contact our office to request authorization.  Prescriptions will not be filled after 5pm or on week-ends. °4. You should eat very light the first 24 hours after surgery, such as soup, crackers, pudding, etc.  Resume your normal diet the day after surgery °5. It is common to experience some constipation if taking pain medication after surgery.  Increasing fluid intake and taking a stool softener will usually help or prevent this problem from occurring.  A mild laxative (Milk of Magnesia or Miralax) should be taken according to package directions if there are no bowel movements after 48 hours. °6. You may shower in 48 hours.  The surgical glue will flake off in 2-3 weeks.   °7. ACTIVITIES:  No strenuous activity or heavy lifting for 1 week.   °a. You may drive when you no longer are taking prescription pain medication, you can comfortably wear a seatbelt, and you can safely maneuver your car and apply brakes. °b. RETURN TO WORK:  __________to be determined._______________ °You should see your doctor in the office for a follow-up appointment approximately three-four weeks after your surgery.   ° °WHEN TO CALL YOUR DOCTOR: °1. Fever over 101.0 °2. Nausea and/or vomiting. °3. Extreme swelling  or bruising. °4. Continued bleeding from incision. °5. Increased pain, redness, or drainage from the incision. ° °The clinic staff is available to answer your questions during regular business hours.  Please don’t hesitate to call and ask to speak to one of the nurses for clinical concerns.  If you have a medical emergency, go to the nearest emergency room or call 911.  A surgeon from [facility] is always on call at the hospital. ° °For further questions, please visit [email or web address]  ° ° ° ° °Post Anesthesia Home Care Instructions ° °Activity: °Get plenty of rest for the remainder of the day. A responsible adult should stay with you for 24 hours following the procedure.  °For the next 24 hours, DO NOT: °-Drive a car °-Operate machinery °-Drink alcoholic beverages °-Take any medication unless instructed by your physician °-Make any legal decisions or sign important papers. ° °Meals: °Start with liquid foods such as gelatin or soup. Progress to regular foods as tolerated. Avoid greasy, spicy, heavy foods. If nausea and/or vomiting occur, drink only clear liquids until the nausea and/or vomiting subsides. Call your physician if vomiting continues. ° °Special Instructions/Symptoms: °Your throat may feel dry or sore from the anesthesia or the breathing tube placed in your throat during surgery. If this causes discomfort, gargle with warm salt water. The discomfort should disappear within 24 hours. ° °If you had a scopolamine patch placed behind your ear for the management of post- operative nausea and/or vomiting: ° °1. The medication in the patch is effective for 72 hours, after   which it should be removed.  Wrap patch in a tissue and discard in the trash. Wash hands thoroughly with soap and water. °2. You may remove the patch earlier than 72 hours if you experience unpleasant side effects which may include dry mouth, dizziness or visual disturbances. °3. Avoid touching the patch. Wash your  hands with soap and water after contact with the patch. °  ° ° °

## 2015-11-26 NOTE — Telephone Encounter (Signed)
Moved patient 12/12 ched class to the one on one 12 pm slot due to patient needs a Optometrist. Adjusted lab also and called patient via [facility] [phone] re change with new appointment time. Patient also inform that a new schedule reflecting this change has been mailed.

## 2015-11-26 NOTE — Anesthesia Procedure Notes (Signed)
Procedure Name: LMA Insertion Date/Time: 11/26/2015 10:13 AM Performed by: Baxter Flattery Pre-anesthesia Checklist: Patient identified, Emergency Drugs available, Suction available and Patient being monitored Patient Re-evaluated:Patient Re-evaluated prior to inductionOxygen Delivery Method: Circle System Utilized Preoxygenation: Pre-oxygenation with 100% oxygen Intubation Type: IV induction Ventilation: Mask ventilation without difficulty LMA: LMA inserted LMA Size: 4.0 Number of attempts: 1 Airway Equipment and Method: Bite block Placement Confirmation: positive ETCO2 and breath sounds checked- equal and bilateral Tube secured with: Tape Dental Injury: Teeth and Oropharynx as per pre-operative assessment

## 2015-11-26 NOTE — Op Note (Signed)
PREOPERATIVE DIAGNOSIS:  Colon cancer     POSTOPERATIVE DIAGNOSIS:  Same     PROCEDURE: Left subclavian port placement, Bard   Power Port, MRI safe, 8-French.      SURGEON:  Stark Klein, MD      ANESTHESIA:  General   FINDINGS:  Good venous return, easy flush, and tip of the catheter and   SVC 21 cm.      SPECIMEN:  None.      ESTIMATED BLOOD LOSS:  Minimal.      COMPLICATIONS:  None known.      PROCEDURE:  Pt was identified in the holding area and taken to   the operating room, where patient was placed supine on the operating room   table.  General anesthesia was induced.  Patient's arms were tucked and the upper   chest and neck were prepped and draped in sterile fashion.  Time-out was   performed according to the surgical safety check list.  When all was   correct, we continued.   Local anesthetic was administered over this   area at the angle of the clavicle.  The vein was accessed with 1 pass of the needle. There was good venous return and the wire passed easily with no ectopy.   Fluoroscopy was used to confirm that the wire was in the vena cava.      The patient was placed back level and the area for the pocket was anethetized   with local anesthetic.  A 3-cm transverse incision was made with a #15   blade.  Cautery was used to divide the subcutaneous tissues down to the   pectoralis muscle.  An [employer] retractor was used to elevate the skin   while a pocket was created on top of the pectoralis fascia.  The port   was placed into the pocket to confirm that it was of adequate size.  The   catheter was preattached to the port.  The port was then secured to the   pectoralis fascia with four 2-0 Prolene sutures.  These were clamped and   not tied down yet.    The catheter was tunneled through to the wire exit   site.  The catheter was placed along the wire to determine what length it should be to be in the SVC.  The catheter was cut at 21 cm.  The tunneler sheath and  dilator were passed over the wire and the dilator and wire were removed.  The catheter was advanced through the tunneler sheath and the tunneler sheath was pulled away.  Care was taken to keep the catheter in the tunneler sheath as this occurred. This was advanced and the tunneler sheath was removed.  There was good venous   return and easy flush of the catheter.  The Prolene sutures were tied   down to the pectoral fascia.  The skin was reapproximated using 3-0   Vicryl interrupted deep dermal sutures.    Fluoroscopy was used to re-confirm good position of the catheter.  The skin   was then closed using 4-0 Monocryl in a subcuticular fashion.  The port was flushed with concentrated heparin flush as well.  The wounds were then cleaned, dried, and dressed with Dermabond.  The patient was awakened from anesthesia and taken to the PACU in stable condition.  Needle, sponge, and instrument counts were correct.               Stark Klein, MD

## 2015-11-26 NOTE — Interval H&P Note (Signed)
History and Physical Interval Note:  11/26/2015 9:09 AM  Brittney [name]  has presented today for surgery, with the diagnosis of COLON CANCER  Metastatic to lymph nodes.  The various methods of treatment have been discussed with the patient and family. After consideration of risks, benefits and other options for treatment, the patient has consented to  Procedure(s): INSERTION PORT-A-CATH (N/A) as a surgical intervention .  The patient's history has been reviewed, patient examined, no change in status, stable for surgery.  I have reviewed the patient's chart and labs.  Questions were answered to the patient's satisfaction.     Ahmar Pickrell

## 2015-11-26 NOTE — Anesthesia Postprocedure Evaluation (Signed)
Anesthesia Post Note  Patient: Brittney [name]  Procedure(s) Performed: Procedure(s) (LRB): INSERTION PORT-A-CATH (Left)  Patient location during evaluation: PACU Anesthesia Type: General Level of consciousness: sedated and patient cooperative Pain management: pain level controlled Vital Signs Assessment: post-procedure vital signs reviewed and stable Respiratory status: spontaneous breathing Cardiovascular status: stable Anesthetic complications: no    Last Vitals:  Filed Vitals:   11/26/15 1145 11/26/15 1202  BP: 160/85 147/83  Pulse: 100 97  Temp:  36.6 C  Resp: 18 16    Last Pain:  Filed Vitals:   11/26/15 1205  PainSc: 0-No pain                 Nolon Nations

## 2015-11-26 NOTE — Transfer of Care (Signed)
Immediate Anesthesia Transfer of Care Note  Patient: Brittney [name]  Procedure(s) Performed: Procedure(s): INSERTION PORT-A-CATH (Left)  Patient Location: PACU  Anesthesia Type:General  Level of Consciousness: awake and sedated  Airway & Oxygen Therapy: Patient Spontanous Breathing and Patient connected to face mask oxygen  Post-op Assessment: Report given to RN, Post -op Vital signs reviewed and stable and Patient moving all extremities  Post vital signs: Reviewed and stable  Last Vitals:  Filed Vitals:   11/26/15 0924  BP: 145/75  Pulse: 96  Temp: 37.1 C  Resp: 16    Complications: No apparent anesthesia complications

## 2015-11-27 ENCOUNTER — Encounter: Payer: Self-pay | Admitting: Oncology

## 2015-11-27 ENCOUNTER — Encounter (HOSPITAL_BASED_OUTPATIENT_CLINIC_OR_DEPARTMENT_OTHER): Payer: Self-pay | Admitting: General Surgery

## 2015-11-27 NOTE — Progress Notes (Signed)
Faxed forms to [facility] [phone] on 11/26/15. Forward copy to medical records.

## 2015-11-30 ENCOUNTER — Other Ambulatory Visit: Payer: Self-pay | Admitting: Oncology

## 2015-12-01 ENCOUNTER — Other Ambulatory Visit (HOSPITAL_BASED_OUTPATIENT_CLINIC_OR_DEPARTMENT_OTHER): Payer: BLUE CROSS/BLUE SHIELD

## 2015-12-01 ENCOUNTER — Other Ambulatory Visit: Payer: BLUE CROSS/BLUE SHIELD

## 2015-12-01 DIAGNOSIS — C187 Malignant neoplasm of sigmoid colon: Secondary | ICD-10-CM

## 2015-12-01 LAB — COMPREHENSIVE METABOLIC PANEL
ALT: 43 U/L (ref 0–55)
ANION GAP: 8 meq/L (ref 3–11)
AST: 23 U/L (ref 5–34)
Albumin: 4 g/dL (ref 3.5–5.0)
Alkaline Phosphatase: 94 U/L (ref 40–150)
BUN: 11.6 mg/dL (ref 7.0–26.0)
CHLORIDE: 109 meq/L (ref 98–109)
CO2: 24 meq/L (ref 22–29)
Calcium: 9.8 mg/dL (ref 8.4–10.4)
Creatinine: 0.8 mg/dL (ref 0.6–1.1)
EGFR: 83 mL/min/{1.73_m2} — AB (ref >90)
GLUCOSE: 103 mg/dL (ref 70–140)
Potassium: 4.2 mEq/L (ref 3.5–5.1)
SODIUM: 141 meq/L (ref 136–145)
Total Bilirubin: 0.42 mg/dL (ref 0.20–1.20)
Total Protein: 7.2 g/dL (ref 6.4–8.3)

## 2015-12-01 LAB — CBC WITH DIFFERENTIAL/PLATELET
BASO%: 1 % (ref 0.0–2.0)
Basophils Absolute: 0.1 10*3/uL (ref 0.0–0.1)
EOS%: 1.3 % (ref 0.0–7.0)
Eosinophils Absolute: 0.1 10*3/uL (ref 0.0–0.5)
HCT: 41.6 % (ref 34.8–46.6)
HGB: 13.9 g/dL (ref 11.6–15.9)
LYMPH%: 19 % (ref 14.0–49.7)
MCH: 29.7 pg (ref 25.1–34.0)
MCHC: 33.4 g/dL (ref 31.5–36.0)
MCV: 89 fL (ref 79.5–101.0)
MONO#: 0.4 10*3/uL (ref 0.1–0.9)
MONO%: 4.2 % (ref 0.0–14.0)
NEUT#: 6.4 10*3/uL (ref 1.5–6.5)
NEUT%: 74.5 % (ref 38.4–76.8)
PLATELETS: 330 10*3/uL (ref 145–400)
RBC: 4.68 10*6/uL (ref 3.70–5.45)
RDW: 13.6 % (ref 11.2–14.5)
WBC: 8.6 10*3/uL (ref 3.9–10.3)
lymph#: 1.6 10*3/uL (ref 0.9–3.3)

## 2015-12-02 ENCOUNTER — Other Ambulatory Visit: Payer: Self-pay | Admitting: *Deleted

## 2015-12-02 DIAGNOSIS — C187 Malignant neoplasm of sigmoid colon: Secondary | ICD-10-CM

## 2015-12-02 NOTE — Progress Notes (Signed)
Left message on voicemail informing pt she does not need lab 12/14. Infusion only tomorrow. Mcarthur Rossetti, interpreter with this info as well. She will attempt to call pt.

## 2015-12-03 ENCOUNTER — Other Ambulatory Visit: Payer: BLUE CROSS/BLUE SHIELD

## 2015-12-03 ENCOUNTER — Encounter: Payer: Self-pay | Admitting: *Deleted

## 2015-12-03 ENCOUNTER — Ambulatory Visit (HOSPITAL_BASED_OUTPATIENT_CLINIC_OR_DEPARTMENT_OTHER): Payer: BLUE CROSS/BLUE SHIELD

## 2015-12-03 VITALS — BP 158/85 | HR 93 | Temp 98.7°F | Resp 17

## 2015-12-03 DIAGNOSIS — Z5111 Encounter for antineoplastic chemotherapy: Secondary | ICD-10-CM

## 2015-12-03 DIAGNOSIS — C187 Malignant neoplasm of sigmoid colon: Secondary | ICD-10-CM

## 2015-12-03 MED ORDER — DEXTROSE 5 % IV SOLN
400.0000 mg/m2 | Freq: Once | INTRAVENOUS | Status: AC
  Administered 2015-12-03: 664 mg via INTRAVENOUS
  Filled 2015-12-03: qty 33.2

## 2015-12-03 MED ORDER — SODIUM CHLORIDE 0.9 % IV SOLN
2400.0000 mg/m2 | INTRAVENOUS | Status: DC
  Administered 2015-12-03: 4000 mg via INTRAVENOUS
  Filled 2015-12-03: qty 80

## 2015-12-03 MED ORDER — DEXTROSE 5 % IV SOLN
Freq: Once | INTRAVENOUS | Status: AC
  Administered 2015-12-03: 11:00:00 via INTRAVENOUS

## 2015-12-03 MED ORDER — SODIUM CHLORIDE 0.9 % IV SOLN
Freq: Once | INTRAVENOUS | Status: AC
  Administered 2015-12-03: 11:00:00 via INTRAVENOUS
  Filled 2015-12-03: qty 4

## 2015-12-03 MED ORDER — OXALIPLATIN CHEMO INJECTION 100 MG/20ML
85.0000 mg/m2 | Freq: Once | INTRAVENOUS | Status: AC
  Administered 2015-12-03: 140 mg via INTRAVENOUS
  Filled 2015-12-03: qty 28

## 2015-12-03 MED ORDER — FLUOROURACIL CHEMO INJECTION 2.5 GM/50ML
400.0000 mg/m2 | Freq: Once | INTRAVENOUS | Status: AC
  Administered 2015-12-03: 650 mg via INTRAVENOUS
  Filled 2015-12-03: qty 13

## 2015-12-03 NOTE — Progress Notes (Signed)
Oncology Nurse Navigator Documentation  Oncology Nurse Navigator Flowsheets 12/03/2015  Referral date to RadOnc/MedOnc -  Navigator Encounter Type Treatment  Patient Visit Type Medonc  Treatment Phase First Chemo Tx-FOLFOX #!  Barriers/Navigation Needs No barriers at this time;Education-antiemetics  Education  Symptom Management-nausea  Interventions Other-provided her copy of the disability forms faxed to [employer] from [employer] Verbal  Support Groups/Services -  Time Spent with Patient 15

## 2015-12-03 NOTE — Patient Instructions (Addendum)
Oxaliplatin Injection What is this medicine? OXALIPLATIN (ox AL i PLA tin) is a chemotherapy drug. It targets fast dividing cells, like cancer cells, and causes these cells to die. This medicine is used to treat cancers of the colon and rectum, and many other cancers. This medicine may be used for other purposes; ask your health care provider or pharmacist if you have questions. What should I tell my health care provider before I take this medicine? They need to know if you have any of these conditions: -kidney disease -an unusual or allergic reaction to oxaliplatin, other chemotherapy, other medicines, foods, dyes, or preservatives -pregnant or trying to get pregnant -breast-feeding How should I use this medicine? This drug is given as an infusion into a vein. It is administered in a hospital or clinic by a specially trained health care professional. Talk to your pediatrician regarding the use of this medicine in children. Special care may be needed. Overdosage: If you think you have taken too much of this medicine contact a poison control center or emergency room at once. NOTE: This medicine is only for you. Do not share this medicine with others. What if I miss a dose? It is important not to miss a dose. Call your doctor or health care professional if you are unable to keep an appointment. What may interact with this medicine? -medicines to increase blood counts like filgrastim, pegfilgrastim, sargramostim -probenecid -some antibiotics like amikacin, gentamicin, neomycin, polymyxin B, streptomycin, tobramycin -zalcitabine Talk to your doctor or health care professional before taking any of these medicines: -acetaminophen -aspirin -ibuprofen -ketoprofen -naproxen This list may not describe all possible interactions. Give your health care provider a list of all the medicines, herbs, non-prescription drugs, or dietary supplements you use. Also tell them if you smoke, drink alcohol, or use  illegal drugs. Some items may interact with your medicine. What should I watch for while using this medicine? Your condition will be monitored carefully while you are receiving this medicine. You will need important blood work done while you are taking this medicine. This medicine can make you more sensitive to cold. Do not drink cold drinks or use ice. Cover exposed skin before coming in contact with cold temperatures or cold objects. When out in cold weather wear warm clothing and cover your mouth and nose to warm the air that goes into your lungs. Tell your doctor if you get sensitive to the cold. This drug may make you feel generally unwell. This is not uncommon, as chemotherapy can affect healthy cells as well as cancer cells. Report any side effects. Continue your course of treatment even though you feel ill unless your doctor tells you to stop. In some cases, you may be given additional medicines to help with side effects. Follow all directions for their use. Call your doctor or health care professional for advice if you get a fever, chills or sore throat, or other symptoms of a cold or flu. Do not treat yourself. This drug decreases your body's ability to fight infections. Try to avoid being around people who are sick. This medicine may increase your risk to bruise or bleed. Call your doctor or health care professional if you notice any unusual bleeding. Be careful brushing and flossing your teeth or using a toothpick because you may get an infection or bleed more easily. If you have any dental work done, tell your dentist you are receiving this medicine. Avoid taking products that contain aspirin, acetaminophen, ibuprofen, naproxen, or ketoprofen unless instructed  by your doctor. These medicines may hide a fever. Do not become pregnant while taking this medicine. Women should inform their doctor if they wish to become pregnant or think they might be pregnant. There is a potential for serious side  effects to an unborn child. Talk to your health care professional or pharmacist for more information. Do not breast-feed an infant while taking this medicine. Call your doctor or health care professional if you get diarrhea. Do not treat yourself. What side effects may I notice from receiving this medicine? Side effects that you should report to your doctor or health care professional as soon as possible: -allergic reactions like skin rash, itching or hives, swelling of the face, lips, or tongue -low blood counts - This drug may decrease the number of white blood cells, red blood cells and platelets. You may be at increased risk for infections and bleeding. -signs of infection - fever or chills, cough, sore throat, pain or difficulty passing urine -signs of decreased platelets or bleeding - bruising, pinpoint red spots on the skin, black, tarry stools, nosebleeds -signs of decreased red blood cells - unusually weak or tired, fainting spells, lightheadedness -breathing problems -chest pain, pressure -cough -diarrhea -jaw tightness -mouth sores -nausea and vomiting -pain, swelling, redness or irritation at the injection site -pain, tingling, numbness in the hands or feet -problems with balance, talking, walking -redness, blistering, peeling or loosening of the skin, including inside the mouth -trouble passing urine or change in the amount of urine Side effects that usually do not require medical attention (report to your doctor or health care professional if they continue or are bothersome): -changes in vision -constipation -hair loss -loss of appetite -metallic taste in the mouth or changes in taste -stomach pain This list may not describe all possible side effects. Call your doctor for medical advice about side effects. You may report side effects to FDA at 1-800-FDA-1088. Where should I keep my medicine? This drug is given in a hospital or clinic and will not be stored at home. NOTE:  This sheet is a summary. It may not cover all possible information. If you have questions about this medicine, talk to your doctor, pharmacist, or health care provider.    2016, Elsevier/Gold Standard. (2008-07-02 17:22:47)  Leucovorin injection What is this medicine? LEUCOVORIN (loo koe VOR in) is used to prevent or treat the harmful effects of some medicines. This medicine is used to treat anemia caused by a low amount of folic acid in the body. It is also used with 5-fluorouracil (5-FU) to treat colon cancer. This medicine may be used for other purposes; ask your health care provider or pharmacist if you have questions. What should I tell my health care provider before I take this medicine? They need to know if you have any of these conditions: -anemia from low levels of vitamin B-12 in the blood -an unusual or allergic reaction to leucovorin, folic acid, other medicines, foods, dyes, or preservatives -pregnant or trying to get pregnant -breast-feeding How should I use this medicine? This medicine is for injection into a muscle or into a vein. It is given by a health care professional in a hospital or clinic setting. Talk to your pediatrician regarding the use of this medicine in children. Special care may be needed. Overdosage: If you think you have taken too much of this medicine contact a poison control center or emergency room at once. NOTE: This medicine is only for you. Do not share this medicine with  others. What if I miss a dose? This does not apply. What may interact with this medicine? -capecitabine -fluorouracil -phenobarbital -phenytoin -primidone -trimethoprim-sulfamethoxazole This list may not describe all possible interactions. Give your health care provider a list of all the medicines, herbs, non-prescription drugs, or dietary supplements you use. Also tell them if you smoke, drink alcohol, or use illegal drugs. Some items may interact with your medicine. What should  I watch for while using this medicine? Your condition will be monitored carefully while you are receiving this medicine. This medicine may increase the side effects of 5-fluorouracil, 5-FU. Tell your doctor or health care professional if you have diarrhea or mouth sores that do not get better or that get worse. What side effects may I notice from receiving this medicine? Side effects that you should report to your doctor or health care professional as soon as possible: -allergic reactions like skin rash, itching or hives, swelling of the face, lips, or tongue -breathing problems -fever, infection -mouth sores -unusual bleeding or bruising -unusually weak or tired Side effects that usually do not require medical attention (report to your doctor or health care professional if they continue or are bothersome): -constipation or diarrhea -loss of appetite -nausea, vomiting This list may not describe all possible side effects. Call your doctor for medical advice about side effects. You may report side effects to FDA at 1-800-FDA-1088. Where should I keep my medicine? This drug is given in a hospital or clinic and will not be stored at home. NOTE: This sheet is a summary. It may not cover all possible information. If you have questions about this medicine, talk to your doctor, pharmacist, or health care provider.    2016, Elsevier/Gold Standard. (2008-06-11 16:50:29)  Fluorouracil, 5-FU injection What is this medicine? FLUOROURACIL, 5-FU (flure oh YOOR a sil) is a chemotherapy drug. It slows the growth of cancer cells. This medicine is used to treat many types of cancer like breast cancer, colon or rectal cancer, pancreatic cancer, and stomach cancer. This medicine may be used for other purposes; ask your health care provider or pharmacist if you have questions. What should I tell my health care provider before I take this medicine? They need to know if you have any of these conditions: -blood  disorders -dihydropyrimidine dehydrogenase (DPD) deficiency -infection (especially a virus infection such as chickenpox, cold sores, or herpes) -kidney disease -liver disease -malnourished, poor nutrition -recent or ongoing radiation therapy -an unusual or allergic reaction to fluorouracil, other chemotherapy, other medicines, foods, dyes, or preservatives -pregnant or trying to get pregnant -breast-feeding How should I use this medicine? This drug is given as an infusion or injection into a vein. It is administered in a hospital or clinic by a specially trained health care professional. Talk to your pediatrician regarding the use of this medicine in children. Special care may be needed. Overdosage: If you think you have taken too much of this medicine contact a poison control center or emergency room at once. NOTE: This medicine is only for you. Do not share this medicine with others. What if I miss a dose? It is important not to miss your dose. Call your doctor or health care professional if you are unable to keep an appointment. What may interact with this medicine? -allopurinol -cimetidine -dapsone -digoxin -hydroxyurea -leucovorin -levamisole -medicines for seizures like ethotoin, fosphenytoin, phenytoin -medicines to increase blood counts like filgrastim, pegfilgrastim, sargramostim -medicines that treat or prevent blood clots like warfarin, enoxaparin, and dalteparin -methotrexate -metronidazole -pyrimethamine -  some other chemotherapy drugs like busulfan, cisplatin, estramustine, vinblastine -trimethoprim -trimetrexate -vaccines Talk to your doctor or health care professional before taking any of these medicines: -acetaminophen -aspirin -ibuprofen -ketoprofen -naproxen This list may not describe all possible interactions. Give your health care provider a list of all the medicines, herbs, non-prescription drugs, or dietary supplements you use. Also tell them if you  smoke, drink alcohol, or use illegal drugs. Some items may interact with your medicine. What should I watch for while using this medicine? Visit your doctor for checks on your progress. This drug may make you feel generally unwell. This is not uncommon, as chemotherapy can affect healthy cells as well as cancer cells. Report any side effects. Continue your course of treatment even though you feel ill unless your doctor tells you to stop. In some cases, you may be given additional medicines to help with side effects. Follow all directions for their use. Call your doctor or health care professional for advice if you get a fever, chills or sore throat, or other symptoms of a cold or flu. Do not treat yourself. This drug decreases your body's ability to fight infections. Try to avoid being around people who are sick. This medicine may increase your risk to bruise or bleed. Call your doctor or health care professional if you notice any unusual bleeding. Be careful brushing and flossing your teeth or using a toothpick because you may get an infection or bleed more easily. If you have any dental work done, tell your dentist you are receiving this medicine. Avoid taking products that contain aspirin, acetaminophen, ibuprofen, naproxen, or ketoprofen unless instructed by your doctor. These medicines may hide a fever. Do not become pregnant while taking this medicine. Women should inform their doctor if they wish to become pregnant or think they might be pregnant. There is a potential for serious side effects to an unborn child. Talk to your health care professional or pharmacist for more information. Do not breast-feed an infant while taking this medicine. Men should inform their doctor if they wish to father a child. This medicine may lower sperm counts. Do not treat diarrhea with over the counter products. Contact your doctor if you have diarrhea that lasts more than 2 days or if it is severe and watery. This  medicine can make you more sensitive to the sun. Keep out of the sun. If you cannot avoid being in the sun, wear protective clothing and use sunscreen. Do not use sun lamps or tanning beds/booths. What side effects may I notice from receiving this medicine? Side effects that you should report to your doctor or health care professional as soon as possible: -allergic reactions like skin rash, itching or hives, swelling of the face, lips, or tongue -low blood counts - this medicine may decrease the number of white blood cells, red blood cells and platelets. You may be at increased risk for infections and bleeding. -signs of infection - fever or chills, cough, sore throat, pain or difficulty passing urine -signs of decreased platelets or bleeding - bruising, pinpoint red spots on the skin, black, tarry stools, blood in the urine -signs of decreased red blood cells - unusually weak or tired, fainting spells, lightheadedness -breathing problems -changes in vision -chest pain -mouth sores -nausea and vomiting -pain, swelling, redness at site where injected -pain, tingling, numbness in the hands or feet -redness, swelling, or sores on hands or feet -stomach pain -unusual bleeding Side effects that usually do not require medical attention (report  to your doctor or health care professional if they continue or are bothersome): -changes in finger or toe nails -diarrhea -dry or itchy skin -hair loss -headache -loss of appetite -sensitivity of eyes to the light -stomach upset -unusually teary eyes This list may not describe all possible side effects. Call your doctor for medical advice about side effects. You may report side effects to FDA at 1-800-FDA-1088. Where should I keep my medicine? This drug is given in a hospital or clinic and will not be stored at home. NOTE: This sheet is a summary. It may not cover all possible information. If you have questions about this medicine, talk to your doctor,  pharmacist, or health care provider.    2016, Elsevier/Gold Standard. (2008-04-10 13:53:16)    [facility] Discharge Instructions for Patients Receiving Chemotherapy  Today you received the following chemotherapy agents Oxaliplatin, Leucovorin and Fluorouracil.   To help prevent nausea and vomiting after your treatment, we encourage you to take your nausea medication as directed.   If you develop nausea and vomiting that is not controlled by your nausea medication, call the clinic.   BELOW ARE SYMPTOMS THAT SHOULD BE REPORTED IMMEDIATELY:  *FEVER GREATER THAN 100.5 F  *CHILLS WITH OR WITHOUT FEVER  NAUSEA AND VOMITING THAT IS NOT CONTROLLED WITH YOUR NAUSEA MEDICATION  *UNUSUAL SHORTNESS OF BREATH  *UNUSUAL BRUISING OR BLEEDING  TENDERNESS IN MOUTH AND THROAT WITH OR WITHOUT PRESENCE OF ULCERS  *URINARY PROBLEMS  *BOWEL PROBLEMS  UNUSUAL RASH Items with * indicate a potential emergency and should be followed up as soon as possible.  Feel free to call the clinic you have any questions or concerns. The clinic phone number is [phone].  Please show the [facility] at check-in to the Emergency Department and triage nurse.

## 2015-12-05 ENCOUNTER — Ambulatory Visit (HOSPITAL_BASED_OUTPATIENT_CLINIC_OR_DEPARTMENT_OTHER): Payer: BLUE CROSS/BLUE SHIELD

## 2015-12-05 VITALS — BP 160/86 | HR 90 | Temp 97.2°F | Resp 18

## 2015-12-05 DIAGNOSIS — C189 Malignant neoplasm of colon, unspecified: Secondary | ICD-10-CM

## 2015-12-05 MED ORDER — SODIUM CHLORIDE 0.9 % IJ SOLN
10.0000 mL | INTRAMUSCULAR | Status: DC | PRN
  Administered 2015-12-05: 10 mL via INTRAVENOUS
  Filled 2015-12-05: qty 10

## 2015-12-05 MED ORDER — HEPARIN SOD (PORK) LOCK FLUSH 100 UNIT/ML IV SOLN
500.0000 [IU] | Freq: Once | INTRAVENOUS | Status: AC
  Administered 2015-12-05: 500 [IU] via INTRAVENOUS
  Filled 2015-12-05: qty 5

## 2015-12-05 NOTE — Patient Instructions (Signed)

## 2015-12-15 ENCOUNTER — Other Ambulatory Visit: Payer: Self-pay | Admitting: Oncology

## 2015-12-17 ENCOUNTER — Encounter: Payer: Self-pay | Admitting: Oncology

## 2015-12-17 ENCOUNTER — Ambulatory Visit (HOSPITAL_BASED_OUTPATIENT_CLINIC_OR_DEPARTMENT_OTHER): Payer: BLUE CROSS/BLUE SHIELD | Admitting: Oncology

## 2015-12-17 ENCOUNTER — Other Ambulatory Visit (HOSPITAL_BASED_OUTPATIENT_CLINIC_OR_DEPARTMENT_OTHER): Payer: BLUE CROSS/BLUE SHIELD

## 2015-12-17 ENCOUNTER — Telehealth: Payer: Self-pay | Admitting: Nurse Practitioner

## 2015-12-17 ENCOUNTER — Ambulatory Visit (HOSPITAL_BASED_OUTPATIENT_CLINIC_OR_DEPARTMENT_OTHER): Payer: BLUE CROSS/BLUE SHIELD

## 2015-12-17 VITALS — BP 165/91 | HR 109 | Temp 98.4°F | Resp 20 | Ht 61.0 in | Wt 144.6 lb

## 2015-12-17 DIAGNOSIS — C187 Malignant neoplasm of sigmoid colon: Secondary | ICD-10-CM

## 2015-12-17 DIAGNOSIS — R918 Other nonspecific abnormal finding of lung field: Secondary | ICD-10-CM

## 2015-12-17 DIAGNOSIS — Z5111 Encounter for antineoplastic chemotherapy: Secondary | ICD-10-CM | POA: Diagnosis not present

## 2015-12-17 DIAGNOSIS — I10 Essential (primary) hypertension: Secondary | ICD-10-CM

## 2015-12-17 LAB — COMPREHENSIVE METABOLIC PANEL
ALK PHOS: 99 U/L (ref 40–150)
ALT: 48 U/L (ref 0–55)
ANION GAP: 12 meq/L — AB (ref 3–11)
AST: 30 U/L (ref 5–34)
Albumin: 4 g/dL (ref 3.5–5.0)
BILIRUBIN TOTAL: 0.41 mg/dL (ref 0.20–1.20)
BUN: 9 mg/dL (ref 7.0–26.0)
CO2: 22 meq/L (ref 22–29)
Calcium: 9.8 mg/dL (ref 8.4–10.4)
Chloride: 107 mEq/L (ref 98–109)
Creatinine: 0.8 mg/dL (ref 0.6–1.1)
EGFR: 80 mL/min/{1.73_m2} — AB (ref >90)
GLUCOSE: 156 mg/dL — AB (ref 70–140)
POTASSIUM: 3.6 meq/L (ref 3.5–5.1)
SODIUM: 141 meq/L (ref 136–145)
TOTAL PROTEIN: 7.2 g/dL (ref 6.4–8.3)

## 2015-12-17 LAB — CBC WITH DIFFERENTIAL/PLATELET
BASO%: 0.5 % (ref 0.0–2.0)
BASOS ABS: 0 10*3/uL (ref 0.0–0.1)
EOS ABS: 0.2 10*3/uL (ref 0.0–0.5)
EOS%: 2.9 % (ref 0.0–7.0)
HCT: 39.9 % (ref 34.8–46.6)
HGB: 13.7 g/dL (ref 11.6–15.9)
LYMPH#: 1.5 10*3/uL (ref 0.9–3.3)
LYMPH%: 26.1 % (ref 14.0–49.7)
MCH: 30.6 pg (ref 25.1–34.0)
MCHC: 34.3 g/dL (ref 31.5–36.0)
MCV: 89.3 fL (ref 79.5–101.0)
MONO#: 0.4 10*3/uL (ref 0.1–0.9)
MONO%: 7 % (ref 0.0–14.0)
NEUT#: 3.7 10*3/uL (ref 1.5–6.5)
NEUT%: 63.5 % (ref 38.4–76.8)
PLATELETS: 302 10*3/uL (ref 145–400)
RBC: 4.47 10*6/uL (ref 3.70–5.45)
RDW: 14.2 % (ref 11.2–14.5)
WBC: 5.9 10*3/uL (ref 3.9–10.3)

## 2015-12-17 MED ORDER — OXALIPLATIN CHEMO INJECTION 100 MG/20ML
85.0000 mg/m2 | Freq: Once | INTRAVENOUS | Status: AC
  Administered 2015-12-17: 140 mg via INTRAVENOUS
  Filled 2015-12-17: qty 28

## 2015-12-17 MED ORDER — SODIUM CHLORIDE 0.9 % IV SOLN
2400.0000 mg/m2 | INTRAVENOUS | Status: DC
  Administered 2015-12-17: 4000 mg via INTRAVENOUS
  Filled 2015-12-17: qty 80

## 2015-12-17 MED ORDER — LEUCOVORIN CALCIUM INJECTION 100 MG
20.0000 mg/m2 | Freq: Once | INTRAMUSCULAR | Status: AC
  Administered 2015-12-17: 34 mg via INTRAVENOUS
  Filled 2015-12-17: qty 1.7

## 2015-12-17 MED ORDER — FLUOROURACIL CHEMO INJECTION 2.5 GM/50ML
400.0000 mg/m2 | Freq: Once | INTRAVENOUS | Status: AC
  Administered 2015-12-17: 650 mg via INTRAVENOUS
  Filled 2015-12-17: qty 13

## 2015-12-17 MED ORDER — DEXTROSE 5 % IV SOLN
Freq: Once | INTRAVENOUS | Status: AC
  Administered 2015-12-17: 11:00:00 via INTRAVENOUS

## 2015-12-17 MED ORDER — SODIUM CHLORIDE 0.9 % IV SOLN
Freq: Once | INTRAVENOUS | Status: AC
  Administered 2015-12-17: 11:00:00 via INTRAVENOUS
  Filled 2015-12-17: qty 4

## 2015-12-17 NOTE — Progress Notes (Signed)
[place] OFFICE PROGRESS NOTE   Referring MD: Paralee Cancel 49 y.o.  Aug 30, 1966  Diagnosis: Colon Cancer   INTERVAL HISTORY:   Brittney [name] is seen for routine follow up with an interpreter. Tolerated her fist cycle of FOLOFOX well. She had some mild neuropathy with cold following her chemotherapy but this has now resolved. Denies mucositis, abdominal pain, nausea, and vomiting. Denies diarrhea. She is ready to proceed with her second cycle of chemotherapy as scheduled today. Her bowels are moving.  Objective:  BP 165/91 mmHg  Pulse 109  Temp(Src) 98.4 F (36.9 C) (Oral)  Resp 20  Ht _0  (1.549 m)  Wt 144 lb 9.6 oz (65.59 kg)  BMI 27.34 kg/m2  SpO2 100%  LMP 07/28/2012   HEENT: Oropharynx without visible mass, neck without mass Lungs: Clear bilaterally Cardiac: Regular rate and rhythm Abdomen: No hepatosplenomegaly, no mass, healed surgical incisions  Vascular: No leg edema Lymph nodes: No cervical, supra-clavicular, axillary, or inguinal nodes Neurologic: Alert and oriented, the motor exam appears intact in the upper and lower extremities Skin: No rash, surgical scar at the upper outer left breast Musculoskeletal: No spine tenderness  Port-A-Cath/PICC-without erythema  LAB:  CBC  Lab Results  Component Value Date   WBC 5.9 12/17/2015   HGB 13.7 12/17/2015   HCT 39.9 12/17/2015   MCV 89.3 12/17/2015   PLT 302 12/17/2015   NEUTROABS 3.7 12/17/2015     CMP      Component Value Date/Time   NA 141 12/01/2015 1150   NA 138 11/02/2015 0548   K 4.2 12/01/2015 1150   K 3.8 11/02/2015 0548   CL 109 11/02/2015 0548   CO2 24 12/01/2015 1150   CO2 24 11/02/2015 0548   GLUCOSE 103 12/01/2015 1150   GLUCOSE 113* 11/02/2015 0548   BUN 11.6 12/01/2015 1150   BUN 7 11/02/2015 0548   CREATININE 0.8 12/01/2015 1150   CREATININE 0.62 11/02/2015 0548   CALCIUM 9.8 12/01/2015 1150   CALCIUM 8.8* 11/02/2015 0548   PROT 7.2  12/01/2015 1150   PROT 7.3 09/09/2015 0950   ALBUMIN 4.0 12/01/2015 1150   ALBUMIN 4.4 09/09/2015 0950   AST 23 12/01/2015 1150   AST 19 09/09/2015 0950   ALT 43 12/01/2015 1150   ALT 30 09/09/2015 0950   ALKPHOS 94 12/01/2015 1150   ALKPHOS 80 09/09/2015 0950   BILITOT 0.42 12/01/2015 1150   BILITOT 0.5 09/09/2015 0950   GFRNONAA >60 11/02/2015 0548   GFRAA >60 11/02/2015 0548    Lab Results  Component Value Date   CEA 0.7 09/09/2015    Medications: I have reviewed the patient's current medications.   Assessment/Plan:   1. 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   Hypertension  3.   G3 P3  4.   Indeterminate bilateral pulmonary nodules on the staging chest CT 09/11/2015   Disposition:   Brittney [name] is tolerating chemotherapy well. Her work has been reviewed and is adequate to proceed with treatment. She will proceed with cycle 2 of FOLFOX as scheduled today.  She will return in 2 weeks for evaluation prior to cycle #3.   Mikey Bussing 12/17/2015,  9:35 AM

## 2015-12-17 NOTE — Telephone Encounter (Signed)
Gave patient avs report and appointments for January  °

## 2015-12-19 ENCOUNTER — Ambulatory Visit (HOSPITAL_BASED_OUTPATIENT_CLINIC_OR_DEPARTMENT_OTHER): Payer: BLUE CROSS/BLUE SHIELD

## 2015-12-19 VITALS — BP 151/80 | Temp 97.0°F | Resp 16

## 2015-12-19 DIAGNOSIS — Z452 Encounter for adjustment and management of vascular access device: Secondary | ICD-10-CM

## 2015-12-19 DIAGNOSIS — C187 Malignant neoplasm of sigmoid colon: Secondary | ICD-10-CM

## 2015-12-19 MED ORDER — SODIUM CHLORIDE 0.9 % IJ SOLN
10.0000 mL | INTRAMUSCULAR | Status: DC | PRN
  Administered 2015-12-19: 10 mL
  Filled 2015-12-19: qty 10

## 2015-12-19 MED ORDER — HEPARIN SOD (PORK) LOCK FLUSH 100 UNIT/ML IV SOLN
500.0000 [IU] | Freq: Once | INTRAVENOUS | Status: AC | PRN
  Administered 2015-12-19: 500 [IU]
  Filled 2015-12-19: qty 5

## 2015-12-28 ENCOUNTER — Other Ambulatory Visit: Payer: Self-pay | Admitting: Oncology

## 2015-12-30 ENCOUNTER — Other Ambulatory Visit: Payer: Self-pay | Admitting: *Deleted

## 2015-12-30 DIAGNOSIS — C187 Malignant neoplasm of sigmoid colon: Secondary | ICD-10-CM

## 2015-12-31 ENCOUNTER — Ambulatory Visit: Payer: BLUE CROSS/BLUE SHIELD

## 2015-12-31 ENCOUNTER — Other Ambulatory Visit: Payer: Self-pay | Admitting: *Deleted

## 2015-12-31 ENCOUNTER — Other Ambulatory Visit (HOSPITAL_BASED_OUTPATIENT_CLINIC_OR_DEPARTMENT_OTHER): Payer: BLUE CROSS/BLUE SHIELD

## 2015-12-31 ENCOUNTER — Ambulatory Visit (HOSPITAL_BASED_OUTPATIENT_CLINIC_OR_DEPARTMENT_OTHER): Payer: BLUE CROSS/BLUE SHIELD | Admitting: Nurse Practitioner

## 2015-12-31 ENCOUNTER — Ambulatory Visit (HOSPITAL_BASED_OUTPATIENT_CLINIC_OR_DEPARTMENT_OTHER): Payer: BLUE CROSS/BLUE SHIELD

## 2015-12-31 VITALS — BP 161/85 | HR 96 | Temp 98.7°F | Resp 18

## 2015-12-31 VITALS — BP 161/85 | HR 96 | Temp 98.7°F | Resp 18 | Ht 61.0 in | Wt 144.2 lb

## 2015-12-31 DIAGNOSIS — Z5111 Encounter for antineoplastic chemotherapy: Secondary | ICD-10-CM | POA: Diagnosis not present

## 2015-12-31 DIAGNOSIS — R918 Other nonspecific abnormal finding of lung field: Secondary | ICD-10-CM

## 2015-12-31 DIAGNOSIS — C187 Malignant neoplasm of sigmoid colon: Secondary | ICD-10-CM | POA: Diagnosis not present

## 2015-12-31 DIAGNOSIS — I10 Essential (primary) hypertension: Secondary | ICD-10-CM | POA: Diagnosis not present

## 2015-12-31 LAB — CBC WITH DIFFERENTIAL/PLATELET
BASO%: 0.5 % (ref 0.0–2.0)
BASOS ABS: 0 10*3/uL (ref 0.0–0.1)
EOS ABS: 0.1 10*3/uL (ref 0.0–0.5)
EOS%: 1.1 % (ref 0.0–7.0)
HEMATOCRIT: 38.1 % (ref 34.8–46.6)
HEMOGLOBIN: 13.2 g/dL (ref 11.6–15.9)
LYMPH#: 1.3 10*3/uL (ref 0.9–3.3)
LYMPH%: 23.9 % (ref 14.0–49.7)
MCH: 31.3 pg (ref 25.1–34.0)
MCHC: 34.6 g/dL (ref 31.5–36.0)
MCV: 90.3 fL (ref 79.5–101.0)
MONO#: 0.6 10*3/uL (ref 0.1–0.9)
MONO%: 10.1 % (ref 0.0–14.0)
NEUT%: 64.4 % (ref 38.4–76.8)
NEUTROS ABS: 3.6 10*3/uL (ref 1.5–6.5)
Platelets: 260 10*3/uL (ref 145–400)
RBC: 4.22 10*6/uL (ref 3.70–5.45)
RDW: 15.1 % — AB (ref 11.2–14.5)
WBC: 5.6 10*3/uL (ref 3.9–10.3)

## 2015-12-31 LAB — COMPREHENSIVE METABOLIC PANEL
ALBUMIN: 4 g/dL (ref 3.5–5.0)
ALK PHOS: 96 U/L (ref 40–150)
ALT: 30 U/L (ref 0–55)
AST: 22 U/L (ref 5–34)
Anion Gap: 10 mEq/L (ref 3–11)
BILIRUBIN TOTAL: 0.42 mg/dL (ref 0.20–1.20)
BUN: 12.8 mg/dL (ref 7.0–26.0)
CALCIUM: 9.6 mg/dL (ref 8.4–10.4)
CO2: 23 mEq/L (ref 22–29)
CREATININE: 0.8 mg/dL (ref 0.6–1.1)
Chloride: 106 mEq/L (ref 98–109)
EGFR: 88 mL/min/{1.73_m2} — ABNORMAL LOW (ref >90)
GLUCOSE: 134 mg/dL (ref 70–140)
POTASSIUM: 3.8 meq/L (ref 3.5–5.1)
SODIUM: 139 meq/L (ref 136–145)
TOTAL PROTEIN: 7 g/dL (ref 6.4–8.3)

## 2015-12-31 MED ORDER — LEUCOVORIN CALCIUM INJECTION 100 MG
20.0000 mg/m2 | Freq: Once | INTRAMUSCULAR | Status: AC
  Administered 2015-12-31: 34 mg via INTRAVENOUS
  Filled 2015-12-31: qty 1.7

## 2015-12-31 MED ORDER — SODIUM CHLORIDE 0.9 % IJ SOLN
10.0000 mL | Freq: Once | INTRAMUSCULAR | Status: AC
  Administered 2015-12-31: 10 mL
  Filled 2015-12-31: qty 10

## 2015-12-31 MED ORDER — OXALIPLATIN CHEMO INJECTION 100 MG/20ML
85.0000 mg/m2 | Freq: Once | INTRAVENOUS | Status: AC
  Administered 2015-12-31: 140 mg via INTRAVENOUS
  Filled 2015-12-31: qty 28

## 2015-12-31 MED ORDER — SODIUM CHLORIDE 0.9 % IV SOLN
2400.0000 mg/m2 | INTRAVENOUS | Status: DC
  Administered 2015-12-31: 4000 mg via INTRAVENOUS
  Filled 2015-12-31: qty 80

## 2015-12-31 MED ORDER — FLUOROURACIL CHEMO INJECTION 2.5 GM/50ML
400.0000 mg/m2 | Freq: Once | INTRAVENOUS | Status: AC
  Administered 2015-12-31: 650 mg via INTRAVENOUS
  Filled 2015-12-31: qty 13

## 2015-12-31 MED ORDER — DEXTROSE 5 % IV SOLN
Freq: Once | INTRAVENOUS | Status: AC
  Administered 2015-12-31: 14:00:00 via INTRAVENOUS

## 2015-12-31 MED ORDER — SODIUM CHLORIDE 0.9 % IV SOLN
Freq: Once | INTRAVENOUS | Status: AC
  Administered 2015-12-31: 15:00:00 via INTRAVENOUS
  Filled 2015-12-31: qty 4

## 2015-12-31 NOTE — Patient Instructions (Signed)
[facility] Discharge Instructions for Patients Receiving Chemotherapy  Today you received the following chemotherapy agents FOLFOX  To help prevent nausea and vomiting after your treatment, we encourage you to take your nausea medication as needed   If you develop nausea and vomiting that is not controlled by your nausea medication, call the clinic.   BELOW ARE SYMPTOMS THAT SHOULD BE REPORTED IMMEDIATELY:  *FEVER GREATER THAN 100.5 F  *CHILLS WITH OR WITHOUT FEVER  NAUSEA AND VOMITING THAT IS NOT CONTROLLED WITH YOUR NAUSEA MEDICATION  *UNUSUAL SHORTNESS OF BREATH  *UNUSUAL BRUISING OR BLEEDING  TENDERNESS IN MOUTH AND THROAT WITH OR WITHOUT PRESENCE OF ULCERS  *URINARY PROBLEMS  *BOWEL PROBLEMS  UNUSUAL RASH Items with * indicate a potential emergency and should be followed up as soon as possible.  Feel free to call the clinic you have any questions or concerns. The clinic phone number is [phone].  Please show the CHEMO ALERT CARD at check-in to the Emergency Department and triage nurse.   

## 2015-12-31 NOTE — Progress Notes (Signed)
  [place] OFFICE PROGRESS NOTE   Diagnosis:  Colon cancer  INTERVAL HISTORY:   [name] completed cycle 2 FOLFOX 12/17/2015. She denies nausea/vomiting. No mouth sores. No diarrhea. She intimately notes mild numbness in the fingertips of cold exposure. No persistent neuropathy symptoms. She has a good appetite. She denies pain.  Objective:  Vital signs in last 24 hours:  Blood pressure 161/85, pulse 96, temperature 98.7 F (37.1 C), temperature source Oral, resp. rate 18, height '5\' 1"'$  (1.549 m), weight 144 lb 3.2 oz (65.409 kg), last menstrual period 07/28/2012, SpO2 100 %.    HEENT: No thrush or ulcers. Resp: Lungs clear bilaterally. Cardio: Regular rate and rhythm. GI: Abdomen soft and nontender. No hepatomegaly. Vascular: No leg edema. Port-A-Cath without erythema.    Lab Results:  Lab Results  Component Value Date   WBC 5.6 12/31/2015   HGB 13.2 12/31/2015   HCT 38.1 12/31/2015   MCV 90.3 12/31/2015   PLT 260 12/31/2015   NEUTROABS 3.6 12/31/2015    Imaging:  No results found.  Medications: I have reviewed the patient's current medications.  Assessment/Plan: 1. 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, status post a robotic sigmoid colectomy 10/29/2015  1 of 16 lymph nodes positive for metastatic adenocarcinoma, lymphovascular invasion present  Microsatellite stable, no loss of mismatch repair protein expression  Cycle 1 adjuvant FOLFOX 12/03/2015  Cycle 2 adjuvant FOLFOX 12/17/2015  Cycle 3 adjuvant FOLFOX 12/31/2015 2. Hypertension 3. G3 P3 4. Indeterminate bilateral pulmonary nodules on the staging chest CT 09/11/2015 5. Port-A-Cath placement by Dr. Barry Dienes 11/26/2015   Disposition: Brittney [name] appears stable. She has completed 2 cycles of adjuvant FOLFOX. Plan to proceed with cycle 3 today as scheduled. She will return for a follow-up visit and cycle 4 in 2 weeks.    Ned Card  ANP/GNP-BC   12/31/2015  1:50 PM

## 2016-01-01 ENCOUNTER — Telehealth: Payer: Self-pay | Admitting: Nurse Practitioner

## 2016-01-01 ENCOUNTER — Encounter: Payer: Self-pay | Admitting: *Deleted

## 2016-01-01 NOTE — Telephone Encounter (Signed)
Appointments made and placed a note for patient to get a new schedule 1/13

## 2016-01-01 NOTE — Progress Notes (Signed)
Patient brought contact information in from her FMLA representative. Asking for specific dates this month to be approved. Forwarded this to [employer], who will follow up with employer. Already has FMLA intermittent from 11/2015 to 05/2016.

## 2016-01-02 ENCOUNTER — Ambulatory Visit (HOSPITAL_BASED_OUTPATIENT_CLINIC_OR_DEPARTMENT_OTHER): Payer: BLUE CROSS/BLUE SHIELD

## 2016-01-02 VITALS — BP 141/85 | HR 70 | Temp 97.9°F | Resp 18

## 2016-01-02 DIAGNOSIS — C187 Malignant neoplasm of sigmoid colon: Secondary | ICD-10-CM

## 2016-01-02 MED ORDER — HEPARIN SOD (PORK) LOCK FLUSH 100 UNIT/ML IV SOLN
500.0000 [IU] | Freq: Once | INTRAVENOUS | Status: AC | PRN
  Administered 2016-01-02: 500 [IU]
  Filled 2016-01-02: qty 5

## 2016-01-02 MED ORDER — SODIUM CHLORIDE 0.9 % IJ SOLN
10.0000 mL | INTRAMUSCULAR | Status: DC | PRN
  Administered 2016-01-02: 10 mL
  Filled 2016-01-02: qty 10

## 2016-01-02 NOTE — Patient Instructions (Signed)
Insercin del dispositivo de perfusin implantable (Implanted Port Insertion) Un dispositivo de perfusin implantable es una va central que tiene una forma redonda y se coloca debajo de la piel. [place] como un acceso intravenoso a largo plazo para:   Medicamentos, como la quimioterapia.  Lquidos.  Nutricin lquida, como nutricin parenteral total (TPN, por sus siglas en ingls).  Anlisis de sangre INFORME A SU MDICO:  Alergias a alimentos o medicamentos.  Medicamentos que toma, incluidos vitaminas, hierbas, gotas oftlmicas, cremas y medicamentos de venta libre.  Cualquier tipo de alergia a Public house manager.  Uso de corticoides (por va oral o cremas).  Problemas anteriores debido a anestsicos o a medicamentos que [employer] sensibilidad.  Antecedentes de hemorragias o cogulos sanguneos.  Cirugas previas.  Otros problemas de salud, incluyendo diabetes y problemas renales.  Posibilidad de embarazo, si corresponde. RIESGOS Y COMPLICACIONES En general, se trata de un procedimiento seguro. Sin embargo, Engineer, technical sales, pueden surgir problemas. Algunos posibles problemas incluyen:  Daos en los vasos sanguneos, hematomas o hemorragias en el lugar de la puncin.  Infeccin.  Cogulo en el vaso sanguneo en el cual est insertado el dispositivo.  Laceracin la piel alrededor del dispositivo.  En muy raras ocasiones, la persona puede presentar una afeccin llamada neumotrax, una acumulacin de aire en el pecho que puede provocar el colapso de uno de los pulmones. La colocacin de estos catteres mediante una gua por imgenes adecuada disminuye de [place] significativa el riesgo de sufrir un neumotrax. ANTES DEL PROCEDIMIENTO   Su mdico podra indicarle que se realice anlisis de sangre. Estos anlisis pueden ayudar a mostrar el funcionamiento de sus riones e hgado. Tambin pueden determinar la eficacia con la que coagula la [place].  Si toma  anticoagulantes, pregntele al mdico cundo debe suspenderlos.  Pdale a alguna persona que lo lleve a su casa. Esto es necesario si lo han sedado para el procedimiento. PROCEDIMIENTO  La insercin del dispositivo de perfusin por lo general demora alrededor de 30 a 41minutos.   Le insertarn una aguja intravenosa en el brazo. A travs de esta aguja, recibir directamente en el organismo analgsicos y [employer] que lo ayudarn a Nurse, children's (sedantes).  Permanecer acostado en la camilla y estar conectado a monitores que registrarn su frecuencia cardaca, presin arterial y respiracin durante el procedimiento.  En su dedo se le colocar un dispositivo que controlar el oxgeno. Se le administrar oxgeno.  Durante el procedimiento, todo se mantendr tan libre de grmenes (estril) como sea posible. La piel cercana al punto de la incisin se limpiar con un antisptico, y se cubrir la zona con toallas estriles. La piel y los tejidos ms profundos alrededor de la zona del dispositivo se adormecern con anestesia local.  Se realizarn dos cortes pequeos (incisiones) en la piel para insertar el dispositivo. Uno se Public affairs consultant cuello para acceder a la vena en la cual se Hydrologist.  Debido a que el depsito del dispositivo se colocar debajo de la piel, se realizar una pequea incisin en la parte superior del pecho y se crear una cavidad pequea para el dispositivo debajo de la piel. El catter que se Clinical cytogeneticist al dispositivo se canaliza hasta una vena central grande en el pecho. Al finalizar el procedimiento, quedar un rea pequea y prominente en el cuerpo, en el lugar donde est el depsito.  La insercin del dispositivo se realizar mediante una gua por imagen para garantizar que se haga de [place].  El depsito tiene  una cubierta de silicona que se puede pinchar con una aguja especial.  El dispositivo se purgar con solucin salina normal, y se extraer sangre  para garantizar que este funcione correctamente.  Cuando el procedimiento finaliza, no quedar nada visible [employer].  Las incisiones se mantendrn unidas con puntos, pegamento quirrgico o una cinta especial. DESPUS DEL PROCEDIMIENTO  Debe permanecer en un rea de recuperacin hasta que desaparezca el efecto de la anestesia. Controlarn su presin arterial y el pulso.  Se tomar una ltima radiografa de trax para controlar la ubicacin del dispositivo y asegurarse de que no haya lesiones en el pulmn.   Esta informacin no tiene Marine scientist el consejo del mdico. Asegrese de hacerle al mdico cualquier pregunta que tenga.   Document Released: 09/26/2013 Document Revised: 12/27/2014 Elsevier Interactive Patient Education [employer].

## 2016-01-11 ENCOUNTER — Other Ambulatory Visit: Payer: Self-pay | Admitting: Oncology

## 2016-01-14 ENCOUNTER — Telehealth: Payer: Self-pay | Admitting: Oncology

## 2016-01-14 ENCOUNTER — Ambulatory Visit (HOSPITAL_BASED_OUTPATIENT_CLINIC_OR_DEPARTMENT_OTHER): Payer: BLUE CROSS/BLUE SHIELD | Admitting: Nurse Practitioner

## 2016-01-14 ENCOUNTER — Ambulatory Visit (HOSPITAL_BASED_OUTPATIENT_CLINIC_OR_DEPARTMENT_OTHER): Payer: BLUE CROSS/BLUE SHIELD

## 2016-01-14 ENCOUNTER — Ambulatory Visit: Payer: BLUE CROSS/BLUE SHIELD

## 2016-01-14 ENCOUNTER — Other Ambulatory Visit: Payer: BLUE CROSS/BLUE SHIELD

## 2016-01-14 ENCOUNTER — Other Ambulatory Visit (HOSPITAL_BASED_OUTPATIENT_CLINIC_OR_DEPARTMENT_OTHER): Payer: BLUE CROSS/BLUE SHIELD

## 2016-01-14 VITALS — BP 160/93 | HR 84 | Temp 98.6°F | Resp 18 | Ht 61.0 in | Wt 144.5 lb

## 2016-01-14 DIAGNOSIS — R918 Other nonspecific abnormal finding of lung field: Secondary | ICD-10-CM

## 2016-01-14 DIAGNOSIS — C779 Secondary and unspecified malignant neoplasm of lymph node, unspecified: Secondary | ICD-10-CM | POA: Diagnosis not present

## 2016-01-14 DIAGNOSIS — C187 Malignant neoplasm of sigmoid colon: Secondary | ICD-10-CM | POA: Diagnosis not present

## 2016-01-14 DIAGNOSIS — Z95828 Presence of other vascular implants and grafts: Secondary | ICD-10-CM

## 2016-01-14 DIAGNOSIS — Z5111 Encounter for antineoplastic chemotherapy: Secondary | ICD-10-CM | POA: Diagnosis not present

## 2016-01-14 LAB — COMPREHENSIVE METABOLIC PANEL
ALT: 57 U/L — ABNORMAL HIGH (ref 0–55)
AST: 36 U/L — ABNORMAL HIGH (ref 5–34)
Albumin: 3.9 g/dL (ref 3.5–5.0)
Alkaline Phosphatase: 111 U/L (ref 40–150)
Anion Gap: 8 mEq/L (ref 3–11)
BILIRUBIN TOTAL: 0.4 mg/dL (ref 0.20–1.20)
BUN: 12.1 mg/dL (ref 7.0–26.0)
CHLORIDE: 107 meq/L (ref 98–109)
CO2: 25 meq/L (ref 22–29)
Calcium: 9.5 mg/dL (ref 8.4–10.4)
Creatinine: 0.8 mg/dL (ref 0.6–1.1)
EGFR: 83 mL/min/{1.73_m2} — AB (ref >90)
GLUCOSE: 147 mg/dL — AB (ref 70–140)
Potassium: 3.6 mEq/L (ref 3.5–5.1)
SODIUM: 141 meq/L (ref 136–145)
TOTAL PROTEIN: 7.2 g/dL (ref 6.4–8.3)

## 2016-01-14 LAB — CBC WITH DIFFERENTIAL/PLATELET
BASO%: 0.4 % (ref 0.0–2.0)
Basophils Absolute: 0 10*3/uL (ref 0.0–0.1)
EOS%: 2.3 % (ref 0.0–7.0)
Eosinophils Absolute: 0.1 10*3/uL (ref 0.0–0.5)
HCT: 39.6 % (ref 34.8–46.6)
HGB: 13.5 g/dL (ref 11.6–15.9)
LYMPH%: 22.5 % (ref 14.0–49.7)
MCH: 31 pg (ref 25.1–34.0)
MCHC: 34.1 g/dL (ref 31.5–36.0)
MCV: 90.8 fL (ref 79.5–101.0)
MONO#: 0.4 10*3/uL (ref 0.1–0.9)
MONO%: 7.4 % (ref 0.0–14.0)
NEUT%: 67.4 % (ref 38.4–76.8)
NEUTROS ABS: 3.2 10*3/uL (ref 1.5–6.5)
Platelets: 209 10*3/uL (ref 145–400)
RBC: 4.36 10*6/uL (ref 3.70–5.45)
RDW: 15.7 % — ABNORMAL HIGH (ref 11.2–14.5)
WBC: 4.8 10*3/uL (ref 3.9–10.3)
lymph#: 1.1 10*3/uL (ref 0.9–3.3)

## 2016-01-14 MED ORDER — FLUOROURACIL CHEMO INJECTION 2.5 GM/50ML
400.0000 mg/m2 | Freq: Once | INTRAVENOUS | Status: AC
  Administered 2016-01-14: 650 mg via INTRAVENOUS
  Filled 2016-01-14: qty 13

## 2016-01-14 MED ORDER — DEXTROSE 5 % IV SOLN
Freq: Once | INTRAVENOUS | Status: AC
  Administered 2016-01-14: 13:00:00 via INTRAVENOUS

## 2016-01-14 MED ORDER — SODIUM CHLORIDE 0.9 % IV SOLN
Freq: Once | INTRAVENOUS | Status: AC
  Administered 2016-01-14: 13:00:00 via INTRAVENOUS
  Filled 2016-01-14: qty 4

## 2016-01-14 MED ORDER — LEUCOVORIN CALCIUM INJECTION 100 MG
20.0000 mg/m2 | Freq: Once | INTRAMUSCULAR | Status: AC
  Administered 2016-01-14: 34 mg via INTRAVENOUS
  Filled 2016-01-14: qty 1.7

## 2016-01-14 MED ORDER — SODIUM CHLORIDE 0.9 % IJ SOLN
10.0000 mL | INTRAMUSCULAR | Status: DC | PRN
  Filled 2016-01-14: qty 10

## 2016-01-14 MED ORDER — SODIUM CHLORIDE 0.9% FLUSH
10.0000 mL | INTRAVENOUS | Status: AC | PRN
  Administered 2016-01-14: 10 mL via INTRAVENOUS
  Filled 2016-01-14: qty 10

## 2016-01-14 MED ORDER — OXALIPLATIN CHEMO INJECTION 100 MG/20ML
85.0000 mg/m2 | Freq: Once | INTRAVENOUS | Status: AC
  Administered 2016-01-14: 140 mg via INTRAVENOUS
  Filled 2016-01-14: qty 20

## 2016-01-14 MED ORDER — HEPARIN SOD (PORK) LOCK FLUSH 100 UNIT/ML IV SOLN
500.0000 [IU] | Freq: Once | INTRAVENOUS | Status: DC | PRN
  Filled 2016-01-14: qty 5

## 2016-01-14 MED ORDER — SODIUM CHLORIDE 0.9 % IV SOLN
2400.0000 mg/m2 | INTRAVENOUS | Status: DC
  Administered 2016-01-14: 4000 mg via INTRAVENOUS
  Filled 2016-01-14: qty 80

## 2016-01-14 NOTE — Telephone Encounter (Signed)
Gv pt appts for Feb.

## 2016-01-14 NOTE — Progress Notes (Signed)
  [facility] OFFICE PROGRESS NOTE   Diagnosis:  Colon cancer  INTERVAL HISTORY:   She returns as scheduled. She completed cycle 3 FOLFOX 12/31/2015. She denies nausea/vomiting. No mouth sores. No diarrhea. Cold sensitivity lasted 3-4 days. No persistent neuropathy symptoms.  Objective:  Vital signs in last 24 hours:  Blood pressure 160/93, pulse 84, temperature 98.6 F (37 C), temperature source Oral, resp. rate 18, height '5\' 1"'$  (1.549 m), weight 144 lb 8 oz (65.545 kg), last menstrual period 07/28/2012, SpO2 100 %.    HEENT: No thrush or ulcers. Resp: Lungs clear bilaterally. Cardio: Regular rate and rhythm. GI: Abdomen soft and nontender. No hepatomegaly. Vascular: No leg edema. Neuro: Vibratory sense intact over the fingertips per tuning fork exam.  Skin: Palms with hyperpigmentation. No erythema or skin breakdown. Port-A-Cath without erythema.    Lab Results:  Lab Results  Component Value Date   WBC 4.8 01/14/2016   HGB 13.5 01/14/2016   HCT 39.6 01/14/2016   MCV 90.8 01/14/2016   PLT 209 01/14/2016   NEUTROABS 3.2 01/14/2016    Imaging:  No results found.  Medications: I have reviewed the patient's current medications.  Assessment/Plan: 1. 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, status post a robotic sigmoid colectomy 10/29/2015  1 of 16 lymph nodes positive for metastatic adenocarcinoma, lymphovascular invasion present  Microsatellite stable, no loss of mismatch repair protein expression  Cycle 1 adjuvant FOLFOX 12/03/2015  Cycle 2 adjuvant FOLFOX 12/17/2015  Cycle 3 adjuvant FOLFOX 12/31/2015  Cycle 4 adjuvant FOLFOX 01/14/2016 2. Hypertension 3. G3 P3 4. Indeterminate bilateral pulmonary nodules on the staging chest CT 09/11/2015 5. Port-A-Cath placement by Dr. Barry Dienes 11/26/2015   Disposition: Ms. Keir appears stable. She has completed 3 cycles of adjuvant FOLFOX. Plan to proceed with  cycle 4 today as scheduled. She will return for a follow-up visit and cycle 5 in 2 weeks.    Ned Card ANP/GNP-BC   01/14/2016  11:38 AM

## 2016-01-14 NOTE — Patient Instructions (Addendum)
[name] Discharge Instructions for Patients Receiving Chemotherapy  Today you received the following chemotherapy agents:  Leucovorin, Fluorouracil, Oxaliplatin  To help prevent nausea and vomiting after your treatment, we encourage you to take your nausea medication as prescribed.   If you develop nausea and vomiting that is not controlled by your nausea medication, call the clinic.   BELOW ARE SYMPTOMS THAT SHOULD BE REPORTED IMMEDIATELY:  *FEVER GREATER THAN 100.5 F  *CHILLS WITH OR WITHOUT FEVER  NAUSEA AND VOMITING THAT IS NOT CONTROLLED WITH YOUR NAUSEA MEDICATION  *UNUSUAL SHORTNESS OF BREATH  *UNUSUAL BRUISING OR BLEEDING  TENDERNESS IN MOUTH AND THROAT WITH OR WITHOUT PRESENCE OF ULCERS  *URINARY PROBLEMS  *BOWEL PROBLEMS  UNUSUAL RASH Items with * indicate a potential emergency and should be followed up as soon as possible.  Feel free to call the clinic you have any questions or concerns. The clinic phone number is [phone].  Please show the [place] at check-in to the Emergency Department and triage nurse.  Return Friday at 2 pm for pump d/c

## 2016-01-16 ENCOUNTER — Ambulatory Visit (HOSPITAL_BASED_OUTPATIENT_CLINIC_OR_DEPARTMENT_OTHER): Payer: BLUE CROSS/BLUE SHIELD

## 2016-01-16 VITALS — BP 157/90 | HR 83 | Temp 98.9°F | Resp 20

## 2016-01-16 DIAGNOSIS — C779 Secondary and unspecified malignant neoplasm of lymph node, unspecified: Secondary | ICD-10-CM | POA: Diagnosis not present

## 2016-01-16 DIAGNOSIS — C187 Malignant neoplasm of sigmoid colon: Secondary | ICD-10-CM

## 2016-01-16 DIAGNOSIS — Z452 Encounter for adjustment and management of vascular access device: Secondary | ICD-10-CM

## 2016-01-16 MED ORDER — HEPARIN SOD (PORK) LOCK FLUSH 100 UNIT/ML IV SOLN
500.0000 [IU] | Freq: Once | INTRAVENOUS | Status: AC | PRN
  Administered 2016-01-16: 500 [IU]
  Filled 2016-01-16: qty 5

## 2016-01-16 MED ORDER — SODIUM CHLORIDE 0.9 % IJ SOLN
10.0000 mL | INTRAMUSCULAR | Status: DC | PRN
  Administered 2016-01-16: 10 mL
  Filled 2016-01-16: qty 10

## 2016-01-16 NOTE — Patient Instructions (Signed)
Dispositivo de perfusin implantable: gua para el hogar (Implanted [place] Guide) Un dispositivo de perfusin implantable es un tipo de va central que se coloca debajo de la piel. Las vas centrales se usan para proporcionar un acceso intravenoso (IV) cuando es necesario administrarle un tratamiento o nutricin a una persona a travs de las venas. Los dispositivos de perfusin implantables se usan para proporcionar un acceso intravenoso a largo plazo. Un dispositivo de perfusin implantable se puede colocar por los siguientes motivos:   Necesita administrarse un medicamento intravenoso que podra provocar una irritacin en las venas pequeas de las manos o los brazos.  Necesita medicamentos por va intravenosa a largo plazo, como antibiticos.  Necesita recibir nutricin por va intravenosa durante un largo perodo.  Es necesario que le extraigan sangre con frecuencia para realizar anlisis de laboratorio.  Debe someterse a dilisis. Los dispositivos de perfusin implantables generalmente se colocan en la zona del pecho, pero tambin se pueden colocar en la parte superior del brazo, el abdomen o la pierna. Un dispositivo de perfusin implantable tiene dos partes principales:   Depsito. El depsito es redondo y lucir como una zona pequea y prominente en la piel. El depsito es la parte en donde se inserta la aguja para administrar los medicamentos o extraer sangre.  Catter. El catter es un tubo delgado y flexible que se extiende desde el depsito. El catter se coloca en una vena grande. Los medicamentos que se introducen en el depsito, pasan a travs del catter y luego llegan a la vena. CMO DEBO CUIDAR EL LUGAR DE LA INCISIN? No humedezca el lugar de la incisin. Bese o dchese segn las indicaciones del mdico.  CMO SE ACCEDE AL DISPOSITIVO DE PERFUSIN? Para acceder al dispositivo debern seguirse algunos pasos especiales.   Antes del procedimiento, podr colocarse una  crema con anestesia sobre la piel. Esto hace adormecer la piel que se encuentra sobre el dispositivo.  Para acceder al dispositivo el mdico usar una tcnica estril.  El mdico debe colocarse una mscara y guantes estriles.  La piel alrededor del dispositivo se limpia cuidadosamente con un antisptico y se deja secar.  Para introducir la aguja en el dispositivo este se debe sostener suavemente entre los guantes estriles.  Slo deben utilizarse agujas "non.coring" (que no dejan agujero) para acceder al dispositivo. Una vez que se ha accedido al dispositivo, deber controlarse el retorno de la sangre. Esto ayuda a garantizar que el dispositivo est en la vena y no est obstruido.  Si es necesario acceder al dispositivo de manera constante para una infusin, se colocar un apsito transparente por encima de la zona de la aguja. El apsito y la aguja debern cambiarse todas las semanas o segn las indicaciones del mdico.  Mantenga limpio y seco el apsito que cubre la aguja. No deje que se humedezca. Siga las indicaciones del mdico sobre cmo debe tomar un bao o una ducha mientras est expuesto el acceso al dispositivo.  Si no es necesario acceder al dispositivo de manera constante, no es necesario colocar un apsito. QU ES EL PURGADO? El purgado ayuda a que el dispositivo no se obstruya. Siga las indicaciones del mdico sobre cmo y cundo debe purgar el dispositivo. Los dispositivos de perfusin generalmente se purgan con una solucin salina o un medicamento llamado heparina. La necesidad de purgar depender de cmo se use el dispositivo.   Si el dispositivo se usa para administrar medicamentos o extraer sangre de manera intermitente, deber purgarse de la siguiente manera:    Despus de la administracin de los medicamentos.  Luego de una extraccin de sangre.  Como parte de una rutina de mantenimiento.  Si la infusin es constante, no necesitar limpiarlo. DURANTE CUNTO TIEMPO  TENDR IMPLANTADO EL DISPOSITIVO? El dispositivo puede permanecer implantado por el tiempo que el mdico considere necesario. Cuando llega el momento de retirar el dispositivo, deber someterse a una ciruga. El procedimiento es similar al que se realiz para colocarlo.  CUNDO DEBO BUSCAR ASISTENCIA MDICA INMEDIATA? Cuando tenga un dispositivo de perfusin implantado, debe buscar atencin mdica de inmediato en los siguientes casos:   Advierte un olor ftido que proviene del lugar de la incisin.  Observa hinchazn, eritemas o secrecin en el lugar de la incisin.  Observa ms hinchazn o siente ms dolor en la zona del dispositivo o a su alrededor.  Tiene fiebre que no puede controlar con medicamentos.   Esta informacin no tiene como fin reemplazar el consejo del mdico. Asegrese de hacerle al mdico cualquier pregunta que tenga.   Document Released: 10/03/2009 Document Revised: 09/26/2013 Elsevier Interactive Patient Education 2016 Elsevier Inc.  

## 2016-01-25 ENCOUNTER — Other Ambulatory Visit: Payer: Self-pay | Admitting: Oncology

## 2016-01-28 ENCOUNTER — Ambulatory Visit (HOSPITAL_BASED_OUTPATIENT_CLINIC_OR_DEPARTMENT_OTHER): Payer: BLUE CROSS/BLUE SHIELD

## 2016-01-28 ENCOUNTER — Other Ambulatory Visit (HOSPITAL_BASED_OUTPATIENT_CLINIC_OR_DEPARTMENT_OTHER): Payer: BLUE CROSS/BLUE SHIELD

## 2016-01-28 ENCOUNTER — Ambulatory Visit (HOSPITAL_BASED_OUTPATIENT_CLINIC_OR_DEPARTMENT_OTHER): Payer: BLUE CROSS/BLUE SHIELD | Admitting: Nurse Practitioner

## 2016-01-28 ENCOUNTER — Other Ambulatory Visit: Payer: BLUE CROSS/BLUE SHIELD

## 2016-01-28 ENCOUNTER — Ambulatory Visit: Payer: BLUE CROSS/BLUE SHIELD

## 2016-01-28 VITALS — BP 166/87 | HR 88 | Temp 98.7°F | Resp 18 | Wt 144.7 lb

## 2016-01-28 DIAGNOSIS — R918 Other nonspecific abnormal finding of lung field: Secondary | ICD-10-CM

## 2016-01-28 DIAGNOSIS — C187 Malignant neoplasm of sigmoid colon: Secondary | ICD-10-CM

## 2016-01-28 DIAGNOSIS — C779 Secondary and unspecified malignant neoplasm of lymph node, unspecified: Secondary | ICD-10-CM | POA: Diagnosis not present

## 2016-01-28 DIAGNOSIS — Z5111 Encounter for antineoplastic chemotherapy: Secondary | ICD-10-CM | POA: Diagnosis not present

## 2016-01-28 DIAGNOSIS — I10 Essential (primary) hypertension: Secondary | ICD-10-CM | POA: Diagnosis not present

## 2016-01-28 DIAGNOSIS — Z95828 Presence of other vascular implants and grafts: Secondary | ICD-10-CM

## 2016-01-28 LAB — CBC WITH DIFFERENTIAL/PLATELET
BASO%: 0.2 % (ref 0.0–2.0)
Basophils Absolute: 0 10*3/uL (ref 0.0–0.1)
EOS%: 1.5 % (ref 0.0–7.0)
Eosinophils Absolute: 0.1 10*3/uL (ref 0.0–0.5)
HCT: 40.4 % (ref 34.8–46.6)
HGB: 13.9 g/dL (ref 11.6–15.9)
LYMPH%: 28.2 % (ref 14.0–49.7)
MCH: 31.2 pg (ref 25.1–34.0)
MCHC: 34.4 g/dL (ref 31.5–36.0)
MCV: 90.8 fL (ref 79.5–101.0)
MONO#: 0.5 10*3/uL (ref 0.1–0.9)
MONO%: 10.6 % (ref 0.0–14.0)
NEUT%: 59.5 % (ref 38.4–76.8)
NEUTROS ABS: 2.7 10*3/uL (ref 1.5–6.5)
Platelets: 173 10*3/uL (ref 145–400)
RBC: 4.45 10*6/uL (ref 3.70–5.45)
RDW: 15.9 % — AB (ref 11.2–14.5)
WBC: 4.6 10*3/uL (ref 3.9–10.3)
lymph#: 1.3 10*3/uL (ref 0.9–3.3)

## 2016-01-28 LAB — COMPREHENSIVE METABOLIC PANEL
ALT: 41 U/L (ref 0–55)
AST: 34 U/L (ref 5–34)
Albumin: 3.9 g/dL (ref 3.5–5.0)
Alkaline Phosphatase: 109 U/L (ref 40–150)
Anion Gap: 11 mEq/L (ref 3–11)
BILIRUBIN TOTAL: 0.44 mg/dL (ref 0.20–1.20)
BUN: 13.2 mg/dL (ref 7.0–26.0)
CO2: 24 meq/L (ref 22–29)
CREATININE: 0.8 mg/dL (ref 0.6–1.1)
Calcium: 9.6 mg/dL (ref 8.4–10.4)
Chloride: 107 mEq/L (ref 98–109)
EGFR: 83 mL/min/{1.73_m2} — AB (ref >90)
GLUCOSE: 125 mg/dL (ref 70–140)
Potassium: 3.6 mEq/L (ref 3.5–5.1)
SODIUM: 142 meq/L (ref 136–145)
TOTAL PROTEIN: 7.4 g/dL (ref 6.4–8.3)

## 2016-01-28 MED ORDER — DEXTROSE 5 % IV SOLN
Freq: Once | INTRAVENOUS | Status: AC
  Administered 2016-01-28: 14:00:00 via INTRAVENOUS

## 2016-01-28 MED ORDER — LIDOCAINE-PRILOCAINE 2.5-2.5 % EX CREA: TOPICAL_CREAM | CUTANEOUS | Status: DC

## 2016-01-28 MED ORDER — FLUOROURACIL CHEMO INJECTION 5 GM/100ML
2400.0000 mg/m2 | INTRAVENOUS | Status: DC
  Administered 2016-01-28: 4000 mg via INTRAVENOUS
  Filled 2016-01-28: qty 80

## 2016-01-28 MED ORDER — LEUCOVORIN CALCIUM INJECTION 100 MG
20.0000 mg/m2 | Freq: Once | INTRAMUSCULAR | Status: AC
  Administered 2016-01-28: 34 mg via INTRAVENOUS
  Filled 2016-01-28: qty 1.7

## 2016-01-28 MED ORDER — PROCHLORPERAZINE MALEATE 10 MG PO TABS: 10.0000 mg | ORAL_TABLET | Freq: Four times a day (QID) | ORAL | Status: DC | PRN

## 2016-01-28 MED ORDER — DEXAMETHASONE SODIUM PHOSPHATE 100 MG/10ML IJ SOLN
Freq: Once | INTRAMUSCULAR | Status: AC
  Administered 2016-01-28: 14:00:00 via INTRAVENOUS
  Filled 2016-01-28: qty 4

## 2016-01-28 MED ORDER — OXALIPLATIN CHEMO INJECTION 100 MG/20ML
85.0000 mg/m2 | Freq: Once | INTRAVENOUS | Status: AC
  Administered 2016-01-28: 140 mg via INTRAVENOUS
  Filled 2016-01-28: qty 20

## 2016-01-28 MED ORDER — SODIUM CHLORIDE 0.9% FLUSH
10.0000 mL | INTRAVENOUS | Status: DC | PRN
  Administered 2016-01-28: 10 mL via INTRAVENOUS
  Filled 2016-01-28: qty 10

## 2016-01-28 MED ORDER — FLUOROURACIL CHEMO INJECTION 2.5 GM/50ML
400.0000 mg/m2 | Freq: Once | INTRAVENOUS | Status: AC
  Administered 2016-01-28: 650 mg via INTRAVENOUS
  Filled 2016-01-28: qty 13

## 2016-01-28 NOTE — Patient Instructions (Signed)
[facility] Discharge Instructions for Patients Receiving Chemotherapy  Today you received the following chemotherapy agents Oxliplatin/Leucovorin/Fluorouracil.  To help prevent nausea and vomiting after your treatment, we encourage you to take your nausea medication as directed.   If you develop nausea and vomiting that is not controlled by your nausea medication, call the clinic.   BELOW ARE SYMPTOMS THAT SHOULD BE REPORTED IMMEDIATELY:  *FEVER GREATER THAN 100.5 F  *CHILLS WITH OR WITHOUT FEVER  NAUSEA AND VOMITING THAT IS NOT CONTROLLED WITH YOUR NAUSEA MEDICATION  *UNUSUAL SHORTNESS OF BREATH  *UNUSUAL BRUISING OR BLEEDING  TENDERNESS IN MOUTH AND THROAT WITH OR WITHOUT PRESENCE OF ULCERS  *URINARY PROBLEMS  *BOWEL PROBLEMS  UNUSUAL RASH Items with * indicate a potential emergency and should be followed up as soon as possible.  Feel free to call the clinic you have any questions or concerns. The clinic phone number is [phone].  Please show the [place] at check-in to the Emergency Department and triage nurse.

## 2016-01-28 NOTE — Progress Notes (Signed)
  [facility] OFFICE PROGRESS NOTE   Diagnosis:  Colon cancer  INTERVAL HISTORY:   Brittney [name] returns as scheduled. She completed cycle 4 adjuvant FOLFOX 01/14/2016. She denies nausea/vomiting. No mouth sores. No diarrhea. She avoids cold for about 5 days following each treatment. No persistent neuropathy symptoms.  Objective:  Vital signs in last 24 hours:  Blood pressure 166/87, pulse 88, temperature 98.7 F (37.1 C), temperature source Oral, resp. rate 18, weight 144 lb 11.2 oz (65.635 kg), last menstrual period 07/28/2012, SpO2 100 %.    HEENT: No thrush or ulcers. Resp: Lungs clear bilaterally. Cardio: Regular rate and rhythm. GI: Abdomen soft and nontender. No hepatomegaly. Vascular: No leg edema. Neuro: Vibratory sense intact in the fingertips per tuning fork exam.  Skin: No rash. Port-A-Cath without erythema.    Lab Results:  Lab Results  Component Value Date   WBC 4.6 01/28/2016   HGB 13.9 01/28/2016   HCT 40.4 01/28/2016   MCV 90.8 01/28/2016   PLT 173 01/28/2016   NEUTROABS 2.7 01/28/2016    Imaging:  No results found.  Medications: I have reviewed the patient's current medications.  Assessment/Plan: 1. 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, status post a robotic sigmoid colectomy 10/29/2015  1 of 16 lymph nodes positive for metastatic adenocarcinoma, lymphovascular invasion present  Microsatellite stable, no loss of mismatch repair protein expression  Cycle 1 adjuvant FOLFOX 12/03/2015  Cycle 2 adjuvant FOLFOX 12/17/2015  Cycle 3 adjuvant FOLFOX 12/31/2015  Cycle 4 adjuvant FOLFOX 01/14/2016  Cycle 5 adjuvant FOLFOX 01/28/2016 2. Hypertension 3. G3 P3 4. Indeterminate bilateral pulmonary nodules on the staging chest CT 09/11/2015 5. Port-A-Cath placement by Dr. Barry Dienes 11/26/2015   Disposition: Brittney [name] appears well. She has completed 4 cycles of adjuvant FOLFOX. Plan to proceed  with cycle 5 today as scheduled. She will return for a follow-up visit and cycle 6 in 2 weeks.  Plan reviewed with Dr. Benay Spice.    Ned Card ANP/GNP-BC   01/28/2016  11:09 AM

## 2016-01-28 NOTE — Patient Instructions (Signed)

## 2016-01-30 ENCOUNTER — Ambulatory Visit (HOSPITAL_BASED_OUTPATIENT_CLINIC_OR_DEPARTMENT_OTHER): Payer: BLUE CROSS/BLUE SHIELD

## 2016-01-30 DIAGNOSIS — C187 Malignant neoplasm of sigmoid colon: Secondary | ICD-10-CM

## 2016-01-30 DIAGNOSIS — C779 Secondary and unspecified malignant neoplasm of lymph node, unspecified: Secondary | ICD-10-CM | POA: Diagnosis not present

## 2016-01-30 DIAGNOSIS — Z95828 Presence of other vascular implants and grafts: Secondary | ICD-10-CM

## 2016-01-30 DIAGNOSIS — Z452 Encounter for adjustment and management of vascular access device: Secondary | ICD-10-CM | POA: Diagnosis not present

## 2016-01-30 MED ORDER — SODIUM CHLORIDE 0.9% FLUSH
10.0000 mL | INTRAVENOUS | Status: DC | PRN
  Administered 2016-01-30: 10 mL via INTRAVENOUS
  Filled 2016-01-30: qty 10

## 2016-01-30 MED ORDER — HEPARIN SOD (PORK) LOCK FLUSH 100 UNIT/ML IV SOLN
500.0000 [IU] | Freq: Once | INTRAVENOUS | Status: AC
  Administered 2016-01-30: 500 [IU] via INTRAVENOUS
  Filled 2016-01-30: qty 5

## 2016-01-30 NOTE — Patient Instructions (Signed)

## 2016-01-31 ENCOUNTER — Telehealth: Payer: Self-pay | Admitting: Oncology

## 2016-01-31 NOTE — Telephone Encounter (Signed)
Left message re new appointments for 3/8 and 3/10. Patient to get new schedule at 2/22 appointment.

## 2016-02-08 ENCOUNTER — Other Ambulatory Visit: Payer: Self-pay | Admitting: Oncology

## 2016-02-11 ENCOUNTER — Ambulatory Visit (HOSPITAL_BASED_OUTPATIENT_CLINIC_OR_DEPARTMENT_OTHER): Payer: BLUE CROSS/BLUE SHIELD

## 2016-02-11 ENCOUNTER — Encounter: Payer: Self-pay | Admitting: *Deleted

## 2016-02-11 ENCOUNTER — Other Ambulatory Visit (HOSPITAL_BASED_OUTPATIENT_CLINIC_OR_DEPARTMENT_OTHER): Payer: BLUE CROSS/BLUE SHIELD

## 2016-02-11 ENCOUNTER — Ambulatory Visit: Payer: BLUE CROSS/BLUE SHIELD

## 2016-02-11 ENCOUNTER — Ambulatory Visit (HOSPITAL_BASED_OUTPATIENT_CLINIC_OR_DEPARTMENT_OTHER): Payer: BLUE CROSS/BLUE SHIELD | Admitting: Oncology

## 2016-02-11 ENCOUNTER — Telehealth: Payer: Self-pay | Admitting: Oncology

## 2016-02-11 ENCOUNTER — Telehealth: Payer: Self-pay | Admitting: *Deleted

## 2016-02-11 VITALS — BP 166/68 | HR 98 | Temp 98.7°F | Resp 18 | Ht 61.0 in | Wt 142.7 lb

## 2016-02-11 DIAGNOSIS — C779 Secondary and unspecified malignant neoplasm of lymph node, unspecified: Secondary | ICD-10-CM

## 2016-02-11 DIAGNOSIS — C187 Malignant neoplasm of sigmoid colon: Secondary | ICD-10-CM | POA: Diagnosis not present

## 2016-02-11 DIAGNOSIS — Z5111 Encounter for antineoplastic chemotherapy: Secondary | ICD-10-CM

## 2016-02-11 DIAGNOSIS — Z95828 Presence of other vascular implants and grafts: Secondary | ICD-10-CM

## 2016-02-11 DIAGNOSIS — R918 Other nonspecific abnormal finding of lung field: Secondary | ICD-10-CM | POA: Diagnosis not present

## 2016-02-11 DIAGNOSIS — I10 Essential (primary) hypertension: Secondary | ICD-10-CM

## 2016-02-11 DIAGNOSIS — G620 Drug-induced polyneuropathy: Secondary | ICD-10-CM

## 2016-02-11 LAB — CBC WITH DIFFERENTIAL/PLATELET
BASO%: 0.3 % (ref 0.0–2.0)
Basophils Absolute: 0 10*3/uL (ref 0.0–0.1)
EOS ABS: 0.1 10*3/uL (ref 0.0–0.5)
EOS%: 2.9 % (ref 0.0–7.0)
HEMATOCRIT: 40 % (ref 34.8–46.6)
HEMOGLOBIN: 13.7 g/dL (ref 11.6–15.9)
LYMPH#: 0.7 10*3/uL — AB (ref 0.9–3.3)
LYMPH%: 20.8 % (ref 14.0–49.7)
MCH: 31.8 pg (ref 25.1–34.0)
MCHC: 34.3 g/dL (ref 31.5–36.0)
MCV: 92.8 fL (ref 79.5–101.0)
MONO#: 0.6 10*3/uL (ref 0.1–0.9)
MONO%: 18.4 % — ABNORMAL HIGH (ref 0.0–14.0)
NEUT%: 57.6 % (ref 38.4–76.8)
NEUTROS ABS: 2 10*3/uL (ref 1.5–6.5)
PLATELETS: 123 10*3/uL — AB (ref 145–400)
RBC: 4.31 10*6/uL (ref 3.70–5.45)
RDW: 16.8 % — AB (ref 11.2–14.5)
WBC: 3.4 10*3/uL — AB (ref 3.9–10.3)

## 2016-02-11 LAB — COMPREHENSIVE METABOLIC PANEL
ALBUMIN: 3.6 g/dL (ref 3.5–5.0)
ALK PHOS: 102 U/L (ref 40–150)
ALT: 38 U/L (ref 0–55)
ANION GAP: 10 meq/L (ref 3–11)
AST: 35 U/L — ABNORMAL HIGH (ref 5–34)
BILIRUBIN TOTAL: 0.57 mg/dL (ref 0.20–1.20)
BUN: 11.8 mg/dL (ref 7.0–26.0)
CO2: 23 meq/L (ref 22–29)
Calcium: 9.3 mg/dL (ref 8.4–10.4)
Chloride: 108 mEq/L (ref 98–109)
Creatinine: 0.9 mg/dL (ref 0.6–1.1)
EGFR: 79 mL/min/{1.73_m2} — ABNORMAL LOW (ref >90)
Glucose: 172 mg/dl — ABNORMAL HIGH (ref 70–140)
Potassium: 3.6 mEq/L (ref 3.5–5.1)
Sodium: 142 mEq/L (ref 136–145)
TOTAL PROTEIN: 7.2 g/dL (ref 6.4–8.3)

## 2016-02-11 MED ORDER — OXALIPLATIN CHEMO INJECTION 100 MG/20ML
85.0000 mg/m2 | Freq: Once | INTRAVENOUS | Status: AC
  Administered 2016-02-11: 140 mg via INTRAVENOUS
  Filled 2016-02-11: qty 8

## 2016-02-11 MED ORDER — SODIUM CHLORIDE 0.9 % IV SOLN
Freq: Once | INTRAVENOUS | Status: AC
  Administered 2016-02-11: 12:00:00 via INTRAVENOUS
  Filled 2016-02-11: qty 4

## 2016-02-11 MED ORDER — FLUOROURACIL CHEMO INJECTION 5 GM/100ML
2400.0000 mg/m2 | INTRAVENOUS | Status: DC
  Administered 2016-02-11: 4000 mg via INTRAVENOUS
  Filled 2016-02-11: qty 80

## 2016-02-11 MED ORDER — FLUOROURACIL CHEMO INJECTION 2.5 GM/50ML
400.0000 mg/m2 | Freq: Once | INTRAVENOUS | Status: AC
  Administered 2016-02-11: 650 mg via INTRAVENOUS
  Filled 2016-02-11: qty 13

## 2016-02-11 MED ORDER — DEXTROSE 5 % IV SOLN
Freq: Once | INTRAVENOUS | Status: AC
  Administered 2016-02-11: 11:00:00 via INTRAVENOUS

## 2016-02-11 MED ORDER — SODIUM CHLORIDE 0.9% FLUSH
10.0000 mL | INTRAVENOUS | Status: DC | PRN
  Administered 2016-02-11: 10 mL via INTRAVENOUS
  Filled 2016-02-11: qty 10

## 2016-02-11 MED ORDER — DEXTROSE 5 % IV SOLN
400.0000 mg/m2 | Freq: Once | INTRAVENOUS | Status: AC
  Administered 2016-02-11: 664 mg via INTRAVENOUS
  Filled 2016-02-11: qty 33.2

## 2016-02-11 NOTE — Telephone Encounter (Signed)
Per staff message and POF I have scheduled appts. Advised scheduler of appts. JMW  

## 2016-02-11 NOTE — Patient Instructions (Signed)
[facility] Discharge Instructions for Patients Receiving Chemotherapy  Today you received the following chemotherapy agents 5 FU/Leucovorin/Oxaliplatin To help prevent nausea and vomiting after your treatment, we encourage you to take your nausea medication as prescribed.   If you develop nausea and vomiting that is not controlled by your nausea medication, call the clinic.   BELOW ARE SYMPTOMS THAT SHOULD BE REPORTED IMMEDIATELY:  *FEVER GREATER THAN 100.5 F  *CHILLS WITH OR WITHOUT FEVER  NAUSEA AND VOMITING THAT IS NOT CONTROLLED WITH YOUR NAUSEA MEDICATION  *UNUSUAL SHORTNESS OF BREATH  *UNUSUAL BRUISING OR BLEEDING  TENDERNESS IN MOUTH AND THROAT WITH OR WITHOUT PRESENCE OF ULCERS  *URINARY PROBLEMS  *BOWEL PROBLEMS  UNUSUAL RASH Items with * indicate a potential emergency and should be followed up as soon as possible.  Feel free to call the clinic you have any questions or concerns. The clinic phone number is [phone].  Please show the CHEMO ALERT CARD at check-in to the Emergency Department and triage nurse.   

## 2016-02-11 NOTE — Telephone Encounter (Signed)
Pt confirmed labs/ov per 02/22 POF, gave pt AVS and Calendar...... KJ, sent msg to add chemo °

## 2016-02-11 NOTE — Progress Notes (Signed)
Oncology Nurse Navigator Documentation  Oncology Nurse Navigator Flowsheets 02/11/2016  Navigator Location [facility]-Med Onc  Navigator Encounter Type Treatment;3 month  Abnormal Finding Date 09/09/2015  Confirmed Diagnosis Date 08/29/2015  Surgery Date 10/29/2015  Treatment Initiated Date 12/03/2015  Patient Visit Type MedOnc  Treatment Phase Active Tx-Cycle # 6 FOLFOX  Barriers/Navigation Needs Family concerns  Education Other-work excuse  Interventions Other--provided letter for employer regarding work   Education Method -  Support Groups/Services -  Acuity Level 1  Time Spent with Patient 15

## 2016-02-11 NOTE — Progress Notes (Signed)
  [name] OFF[facility] PROGRESS NOTE   Diagnosis: Colon cancer  INTERVAL HISTORY:   She returns as scheduled. She completed another cycle of FOLFOX 01/28/2016. No mouth sores, diarrhea, nausea/vomiting. No significant neuropathy symptoms.  Objective:  Vital signs in last 24 hours:  Blood pressure 166/68, pulse 98, temperature 98.7 F (37.1 C), temperature source Oral, resp. rate 18, height _0  (1.549 m), weight 142 lb 11.2 oz (64.728 kg), last menstrual period 07/28/2012, SpO2 100 %.    HEENT: No thrush or ulcers Resp: Lungs clear bilaterally Cardio: Regular rate and rhythm GI: No hepatomegaly, nontender Vascular: No leg edema  Skin: Mild hyperpigmentation of the hands, no skin breakdown Neurologic: Very mild decrease in vibratory sense at the fingers bilaterally   Portacath/PICC-without erythema  Lab Results:  Lab Results  Component Value Date   WBC 3.4* 02/11/2016   HGB 13.7 02/11/2016   HCT 40.0 02/11/2016   MCV 92.8 02/11/2016   PLT 123* 02/11/2016   NEUTROABS 2.0 02/11/2016     Medications: I have reviewed the patient's current medications.  Assessment/Plan: 1. 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, status post a robotic sigmoid colectomy 10/29/2015  1 of 16 lymph nodes positive for metastatic adenocarcinoma, lymphovascular invasion present  Microsatellite stable, no loss of mismatch repair protein expression  Cycle 1 adjuvant FOLFOX 12/03/2015  Cycle 2 adjuvant FOLFOX 12/17/2015  Cycle 3 adjuvant FOLFOX 12/31/2015  Cycle 4 adjuvant FOLFOX 01/14/2016  Cycle 5 adjuvant FOLFOX 01/28/2016  Cycle 6 adjuvant FOLFOX 02/11/2016   2. Hypertension 3. G3 P3 4. Indeterminate bilateral pulmonary nodules on the staging chest CT 09/11/2015 5. Port-A-Cath placement by Dr. Barry Dienes 11/26/2015 6. Mild oxaliplatin neuropathy   Disposition:  Brittney [name] continues to tolerate the chemotherapy well. She has  mild oxaliplatin neuropathy from which she is asymptomatic. She will complete cycle 6 FOLFOX today. She will return for an office visit and chemotherapy in 2 weeks.  [name], MD  02/11/2016  11:04 AM

## 2016-02-13 ENCOUNTER — Ambulatory Visit (HOSPITAL_BASED_OUTPATIENT_CLINIC_OR_DEPARTMENT_OTHER): Payer: BLUE CROSS/BLUE SHIELD

## 2016-02-13 DIAGNOSIS — C187 Malignant neoplasm of sigmoid colon: Secondary | ICD-10-CM | POA: Diagnosis not present

## 2016-02-13 DIAGNOSIS — C779 Secondary and unspecified malignant neoplasm of lymph node, unspecified: Secondary | ICD-10-CM | POA: Diagnosis not present

## 2016-02-13 DIAGNOSIS — Z95828 Presence of other vascular implants and grafts: Secondary | ICD-10-CM

## 2016-02-13 MED ORDER — HEPARIN SOD (PORK) LOCK FLUSH 100 UNIT/ML IV SOLN
500.0000 [IU] | Freq: Once | INTRAVENOUS | Status: AC
  Administered 2016-02-13: 500 [IU] via INTRAVENOUS
  Filled 2016-02-13: qty 5

## 2016-02-13 MED ORDER — SODIUM CHLORIDE 0.9% FLUSH
10.0000 mL | INTRAVENOUS | Status: DC | PRN
  Administered 2016-02-13: 10 mL via INTRAVENOUS
  Filled 2016-02-13: qty 10

## 2016-02-13 NOTE — Patient Instructions (Signed)

## 2016-02-22 ENCOUNTER — Other Ambulatory Visit: Payer: Self-pay | Admitting: Oncology

## 2016-02-25 ENCOUNTER — Encounter: Payer: Self-pay | Admitting: *Deleted

## 2016-02-25 ENCOUNTER — Other Ambulatory Visit (HOSPITAL_BASED_OUTPATIENT_CLINIC_OR_DEPARTMENT_OTHER): Payer: BLUE CROSS/BLUE SHIELD

## 2016-02-25 ENCOUNTER — Ambulatory Visit (HOSPITAL_BASED_OUTPATIENT_CLINIC_OR_DEPARTMENT_OTHER): Payer: BLUE CROSS/BLUE SHIELD | Admitting: Nurse Practitioner

## 2016-02-25 ENCOUNTER — Encounter: Payer: Self-pay | Admitting: Nurse Practitioner

## 2016-02-25 ENCOUNTER — Ambulatory Visit (HOSPITAL_BASED_OUTPATIENT_CLINIC_OR_DEPARTMENT_OTHER): Payer: BLUE CROSS/BLUE SHIELD

## 2016-02-25 ENCOUNTER — Telehealth: Payer: Self-pay | Admitting: *Deleted

## 2016-02-25 ENCOUNTER — Ambulatory Visit: Payer: BLUE CROSS/BLUE SHIELD

## 2016-02-25 ENCOUNTER — Telehealth: Payer: Self-pay | Admitting: Oncology

## 2016-02-25 VITALS — BP 163/95 | HR 86 | Temp 98.6°F | Resp 18 | Ht 61.0 in | Wt 142.0 lb

## 2016-02-25 DIAGNOSIS — Z5111 Encounter for antineoplastic chemotherapy: Secondary | ICD-10-CM | POA: Diagnosis not present

## 2016-02-25 DIAGNOSIS — C187 Malignant neoplasm of sigmoid colon: Secondary | ICD-10-CM

## 2016-02-25 DIAGNOSIS — C779 Secondary and unspecified malignant neoplasm of lymph node, unspecified: Secondary | ICD-10-CM

## 2016-02-25 DIAGNOSIS — Z95828 Presence of other vascular implants and grafts: Secondary | ICD-10-CM

## 2016-02-25 LAB — CBC WITH DIFFERENTIAL/PLATELET
BASO%: 0.6 % (ref 0.0–2.0)
BASOS ABS: 0 10*3/uL (ref 0.0–0.1)
EOS%: 1.4 % (ref 0.0–7.0)
Eosinophils Absolute: 0.1 10*3/uL (ref 0.0–0.5)
HEMATOCRIT: 39.9 % (ref 34.8–46.6)
HGB: 13.5 g/dL (ref 11.6–15.9)
LYMPH#: 1.1 10*3/uL (ref 0.9–3.3)
LYMPH%: 31.9 % (ref 14.0–49.7)
MCH: 31.1 pg (ref 25.1–34.0)
MCHC: 33.8 g/dL (ref 31.5–36.0)
MCV: 91.9 fL (ref 79.5–101.0)
MONO#: 0.4 10*3/uL (ref 0.1–0.9)
MONO%: 12 % (ref 0.0–14.0)
NEUT#: 1.9 10*3/uL (ref 1.5–6.5)
NEUT%: 54.1 % (ref 38.4–76.8)
Platelets: 200 10*3/uL (ref 145–400)
RBC: 4.34 10*6/uL (ref 3.70–5.45)
RDW: 16 % — ABNORMAL HIGH (ref 11.2–14.5)
WBC: 3.6 10*3/uL — ABNORMAL LOW (ref 3.9–10.3)

## 2016-02-25 LAB — COMPREHENSIVE METABOLIC PANEL
ALK PHOS: 119 U/L (ref 40–150)
ALT: 26 U/L (ref 0–55)
ANION GAP: 9 meq/L (ref 3–11)
AST: 25 U/L (ref 5–34)
Albumin: 3.7 g/dL (ref 3.5–5.0)
BILIRUBIN TOTAL: 0.42 mg/dL (ref 0.20–1.20)
BUN: 9.2 mg/dL (ref 7.0–26.0)
CALCIUM: 9.8 mg/dL (ref 8.4–10.4)
CHLORIDE: 108 meq/L (ref 98–109)
CO2: 24 mEq/L (ref 22–29)
CREATININE: 0.8 mg/dL (ref 0.6–1.1)
EGFR: 84 mL/min/{1.73_m2} — ABNORMAL LOW (ref >90)
Glucose: 134 mg/dl (ref 70–140)
POTASSIUM: 3.8 meq/L (ref 3.5–5.1)
Sodium: 141 mEq/L (ref 136–145)
Total Protein: 7.4 g/dL (ref 6.4–8.3)

## 2016-02-25 MED ORDER — SODIUM CHLORIDE 0.9 % IV SOLN
2400.0000 mg/m2 | INTRAVENOUS | Status: DC
  Administered 2016-02-25: 4000 mg via INTRAVENOUS
  Filled 2016-02-25: qty 80

## 2016-02-25 MED ORDER — DEXTROSE 5 % IV SOLN
Freq: Once | INTRAVENOUS | Status: AC
  Administered 2016-02-25: 11:00:00 via INTRAVENOUS

## 2016-02-25 MED ORDER — LEUCOVORIN CALCIUM INJECTION 350 MG
400.0000 mg/m2 | Freq: Once | INTRAVENOUS | Status: AC
  Administered 2016-02-25: 664 mg via INTRAVENOUS
  Filled 2016-02-25: qty 33.2

## 2016-02-25 MED ORDER — SODIUM CHLORIDE 0.9% FLUSH
10.0000 mL | INTRAVENOUS | Status: DC | PRN
  Administered 2016-02-25: 10 mL via INTRAVENOUS
  Filled 2016-02-25: qty 10

## 2016-02-25 MED ORDER — SODIUM CHLORIDE 0.9 % IV SOLN
Freq: Once | INTRAVENOUS | Status: AC
  Administered 2016-02-25: 11:00:00 via INTRAVENOUS
  Filled 2016-02-25: qty 4

## 2016-02-25 MED ORDER — OXALIPLATIN CHEMO INJECTION 100 MG/20ML
85.0000 mg/m2 | Freq: Once | INTRAVENOUS | Status: AC
  Administered 2016-02-25: 140 mg via INTRAVENOUS
  Filled 2016-02-25: qty 8

## 2016-02-25 MED ORDER — FLUOROURACIL CHEMO INJECTION 2.5 GM/50ML
400.0000 mg/m2 | Freq: Once | INTRAVENOUS | Status: AC
  Administered 2016-02-25: 650 mg via INTRAVENOUS
  Filled 2016-02-25: qty 13

## 2016-02-25 NOTE — Telephone Encounter (Signed)
Per staff message and POF I have scheduled appts. Advised scheduler of appts. JMW  

## 2016-02-25 NOTE — Patient Instructions (Signed)
[facility] Discharge Instructions for Patients Receiving Chemotherapy  Today you received the following chemotherapy agents: Oxaliplatin, Leucovorin, and Adrucil   To help prevent nausea and vomiting after your treatment, we encourage you to take your nausea medication as directed.    If you develop nausea and vomiting that is not controlled by your nausea medication, call the clinic.   BELOW ARE SYMPTOMS THAT SHOULD BE REPORTED IMMEDIATELY:  *FEVER GREATER THAN 100.5 F  *CHILLS WITH OR WITHOUT FEVER  NAUSEA AND VOMITING THAT IS NOT CONTROLLED WITH YOUR NAUSEA MEDICATION  *UNUSUAL SHORTNESS OF BREATH  *UNUSUAL BRUISING OR BLEEDING  TENDERNESS IN MOUTH AND THROAT WITH OR WITHOUT PRESENCE OF ULCERS  *URINARY PROBLEMS  *BOWEL PROBLEMS  UNUSUAL RASH Items with * indicate a potential emergency and should be followed up as soon as possible.  Feel free to call the clinic you have any questions or concerns. The clinic phone number is [phone].  Please show the CHEMO ALERT CARD at check-in to the Emergency Department and triage nurse.   

## 2016-02-25 NOTE — Telephone Encounter (Signed)
per pof to sch pt appt-sent MW email to sch trmt-pt to get updated copy b4 leaving trmt room °

## 2016-02-25 NOTE — Progress Notes (Signed)
  [facility] OFFICE PROGRESS NOTE   Diagnosis:  Colon cancer  INTERVAL HISTORY:   Brittney [name] returns as scheduled. She completed cycle 6 adjuvant FOLFOX 02/11/2016. She denies nausea/vomiting. No mouth sores. No diarrhea. Cold sensitivity typically lasts about 5 days. No persistent neuropathy symptoms. She has a good appetite. No pain.  Objective:  Vital signs in last 24 hours:  Blood pressure 163/95, pulse 86, temperature 98.6 F (37 C), temperature source Oral, resp. rate 18, height '5\' 1"'$  (1.549 m), weight 142 lb (64.411 kg), last menstrual period 07/28/2012, SpO2 100 %.    HEENT: No thrush or ulcers. Resp: Lungs clear bilaterally. Cardio: Regular rate and rhythm. GI: Abdomen soft and nontender. No hepatomegaly. Vascular: No leg edema. Neuro: Vibratory sense mildly decreased over the fingertips per tuning fork exam.  Port-A-Cath without erythema.  Lab Results:  Lab Results  Component Value Date   WBC 3.6* 02/25/2016   HGB 13.5 02/25/2016   HCT 39.9 02/25/2016   MCV 91.9 02/25/2016   PLT 200 02/25/2016   NEUTROABS 1.9 02/25/2016    Imaging:  No results found.  Medications: I have reviewed the patient's current medications.  Assessment/Plan: 1. 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, status post a robotic sigmoid colectomy 10/29/2015  1 of 16 lymph nodes positive for metastatic adenocarcinoma, lymphovascular invasion present  Microsatellite stable, no loss of mismatch repair protein expression  Cycle 1 adjuvant FOLFOX 12/03/2015  Cycle 2 adjuvant FOLFOX 12/17/2015  Cycle 3 adjuvant FOLFOX 12/31/2015  Cycle 4 adjuvant FOLFOX 01/14/2016  Cycle 5 adjuvant FOLFOX 01/28/2016  Cycle 6 adjuvant FOLFOX 02/11/2016  Cycle 7 adjuvant FOLFOX 02/25/2016 2. Hypertension 3. G3 P3 4. Indeterminate bilateral pulmonary nodules on the staging chest CT 09/11/2015 5. Port-A-Cath placement by Dr. Barry Dienes  11/26/2015 6. Mild oxaliplatin neuropathy   Disposition: Brittney [name] appears stable. She has completed 6 cycles of adjuvant FOLFOX. Plan to proceed with cycle 7 today as scheduled. She will return for a follow-up visit and cycle 8 in 2 weeks. She will contact the office in the interim with any problems.  We discussed that her blood pressure has been elevated on multiple readings in our office. She reports good compliance with her blood pressure medication. She further reports that her blood pressure is much lower outside of our office.    Ned Card ANP/GNP-BC   02/25/2016  9:57 AM

## 2016-02-25 NOTE — Progress Notes (Signed)
Oncology Nurse Navigator Documentation  Oncology Nurse Navigator Flowsheets 02/25/2016  Navigator Location [facility]  Navigator Encounter Type Treatment;3 month  Abnormal Finding Date -  Confirmed Diagnosis Date -  Surgery Date -  Treatment Initiated Date -  Patient Visit Type MedOnc  Treatment Phase Active Tx  Barriers/Navigation Needs No barriers at this time;No Questions;No Needs  Education -  Interventions -Aidah reports feeling well, still works after pump d/c. No home needs, gaining weight. Routed today's office note to her PCP to alert that her BP runs high when she is in office.  Education Method -  Support Groups/Services -  Acuity -  Time Spent with Patient 15

## 2016-02-25 NOTE — Patient Instructions (Signed)

## 2016-02-27 ENCOUNTER — Ambulatory Visit (HOSPITAL_BASED_OUTPATIENT_CLINIC_OR_DEPARTMENT_OTHER): Payer: BLUE CROSS/BLUE SHIELD

## 2016-02-27 VITALS — BP 139/82 | HR 89 | Temp 98.7°F | Resp 16

## 2016-02-27 DIAGNOSIS — C779 Secondary and unspecified malignant neoplasm of lymph node, unspecified: Secondary | ICD-10-CM

## 2016-02-27 DIAGNOSIS — C187 Malignant neoplasm of sigmoid colon: Secondary | ICD-10-CM | POA: Diagnosis not present

## 2016-02-27 DIAGNOSIS — Z5111 Encounter for antineoplastic chemotherapy: Secondary | ICD-10-CM | POA: Diagnosis not present

## 2016-02-27 MED ORDER — SODIUM CHLORIDE 0.9 % IJ SOLN
10.0000 mL | INTRAMUSCULAR | Status: DC | PRN
  Administered 2016-02-27: 10 mL
  Filled 2016-02-27: qty 10

## 2016-02-27 MED ORDER — HEPARIN SOD (PORK) LOCK FLUSH 100 UNIT/ML IV SOLN
500.0000 [IU] | Freq: Once | INTRAVENOUS | Status: AC | PRN
  Administered 2016-02-27: 500 [IU]
  Filled 2016-02-27: qty 5

## 2016-02-27 NOTE — Patient Instructions (Signed)

## 2016-03-17 ENCOUNTER — Telehealth: Payer: Self-pay | Admitting: Oncology

## 2016-03-17 ENCOUNTER — Ambulatory Visit: Payer: BLUE CROSS/BLUE SHIELD

## 2016-03-17 ENCOUNTER — Other Ambulatory Visit (HOSPITAL_BASED_OUTPATIENT_CLINIC_OR_DEPARTMENT_OTHER): Payer: BLUE CROSS/BLUE SHIELD

## 2016-03-17 ENCOUNTER — Ambulatory Visit (HOSPITAL_BASED_OUTPATIENT_CLINIC_OR_DEPARTMENT_OTHER): Payer: BLUE CROSS/BLUE SHIELD | Admitting: Oncology

## 2016-03-17 ENCOUNTER — Ambulatory Visit (HOSPITAL_BASED_OUTPATIENT_CLINIC_OR_DEPARTMENT_OTHER): Payer: BLUE CROSS/BLUE SHIELD

## 2016-03-17 ENCOUNTER — Encounter: Payer: Self-pay | Admitting: *Deleted

## 2016-03-17 VITALS — BP 162/93 | HR 79

## 2016-03-17 VITALS — BP 170/98 | HR 95 | Temp 98.2°F | Resp 19 | Ht 61.0 in | Wt 146.3 lb

## 2016-03-17 DIAGNOSIS — Z95828 Presence of other vascular implants and grafts: Secondary | ICD-10-CM

## 2016-03-17 DIAGNOSIS — C187 Malignant neoplasm of sigmoid colon: Secondary | ICD-10-CM

## 2016-03-17 DIAGNOSIS — Z5111 Encounter for antineoplastic chemotherapy: Secondary | ICD-10-CM

## 2016-03-17 DIAGNOSIS — R918 Other nonspecific abnormal finding of lung field: Secondary | ICD-10-CM | POA: Diagnosis not present

## 2016-03-17 DIAGNOSIS — G620 Drug-induced polyneuropathy: Secondary | ICD-10-CM

## 2016-03-17 DIAGNOSIS — C779 Secondary and unspecified malignant neoplasm of lymph node, unspecified: Secondary | ICD-10-CM

## 2016-03-17 DIAGNOSIS — I10 Essential (primary) hypertension: Secondary | ICD-10-CM

## 2016-03-17 LAB — COMPREHENSIVE METABOLIC PANEL
ALT: 25 U/L (ref 0–55)
ANION GAP: 10 meq/L (ref 3–11)
AST: 25 U/L (ref 5–34)
Albumin: 3.8 g/dL (ref 3.5–5.0)
Alkaline Phosphatase: 115 U/L (ref 40–150)
BILIRUBIN TOTAL: 0.3 mg/dL (ref 0.20–1.20)
BUN: 15.3 mg/dL (ref 7.0–26.0)
CHLORIDE: 108 meq/L (ref 98–109)
CO2: 23 meq/L (ref 22–29)
CREATININE: 0.8 mg/dL (ref 0.6–1.1)
Calcium: 9.9 mg/dL (ref 8.4–10.4)
EGFR: 85 mL/min/{1.73_m2} — ABNORMAL LOW (ref >90)
GLUCOSE: 153 mg/dL — AB (ref 70–140)
Potassium: 3.8 mEq/L (ref 3.5–5.1)
SODIUM: 142 meq/L (ref 136–145)
TOTAL PROTEIN: 7.6 g/dL (ref 6.4–8.3)

## 2016-03-17 LAB — CBC WITH DIFFERENTIAL/PLATELET
BASO%: 2.2 % — ABNORMAL HIGH (ref 0.0–2.0)
BASOS ABS: 0.1 10*3/uL (ref 0.0–0.1)
EOS%: 1.5 % (ref 0.0–7.0)
Eosinophils Absolute: 0.1 10*3/uL (ref 0.0–0.5)
HEMATOCRIT: 42.5 % (ref 34.8–46.6)
HGB: 14.1 g/dL (ref 11.6–15.9)
LYMPH%: 25.8 % (ref 14.0–49.7)
MCH: 31.5 pg (ref 25.1–34.0)
MCHC: 33.2 g/dL (ref 31.5–36.0)
MCV: 94.8 fL (ref 79.5–101.0)
MONO#: 0.5 10*3/uL (ref 0.1–0.9)
MONO%: 10.6 % (ref 0.0–14.0)
NEUT#: 2.7 10*3/uL (ref 1.5–6.5)
NEUT%: 59.9 % (ref 38.4–76.8)
PLATELETS: 229 10*3/uL (ref 145–400)
RBC: 4.48 10*6/uL (ref 3.70–5.45)
RDW: 17.7 % — ABNORMAL HIGH (ref 11.2–14.5)
WBC: 4.5 10*3/uL (ref 3.9–10.3)
lymph#: 1.2 10*3/uL (ref 0.9–3.3)

## 2016-03-17 LAB — TECHNOLOGIST REVIEW

## 2016-03-17 MED ORDER — FLUOROURACIL CHEMO INJECTION 2.5 GM/50ML
400.0000 mg/m2 | Freq: Once | INTRAVENOUS | Status: AC
  Administered 2016-03-17: 650 mg via INTRAVENOUS
  Filled 2016-03-17: qty 13

## 2016-03-17 MED ORDER — DEXTROSE 5 % IV SOLN
Freq: Once | INTRAVENOUS | Status: AC
  Administered 2016-03-17: 11:00:00 via INTRAVENOUS

## 2016-03-17 MED ORDER — SODIUM CHLORIDE 0.9 % IV SOLN
2400.0000 mg/m2 | INTRAVENOUS | Status: DC
  Administered 2016-03-17: 4000 mg via INTRAVENOUS
  Filled 2016-03-17: qty 80

## 2016-03-17 MED ORDER — SODIUM CHLORIDE 0.9% FLUSH
10.0000 mL | INTRAVENOUS | Status: DC | PRN
  Administered 2016-03-17: 10 mL via INTRAVENOUS
  Filled 2016-03-17: qty 10

## 2016-03-17 MED ORDER — LEUCOVORIN CALCIUM INJECTION 350 MG
400.0000 mg/m2 | Freq: Once | INTRAMUSCULAR | Status: AC
  Administered 2016-03-17: 664 mg via INTRAVENOUS
  Filled 2016-03-17: qty 33.2

## 2016-03-17 MED ORDER — OXALIPLATIN CHEMO INJECTION 100 MG/20ML
85.0000 mg/m2 | Freq: Once | INTRAVENOUS | Status: AC
  Administered 2016-03-17: 140 mg via INTRAVENOUS
  Filled 2016-03-17: qty 20

## 2016-03-17 MED ORDER — SODIUM CHLORIDE 0.9 % IV SOLN
Freq: Once | INTRAVENOUS | Status: AC
  Administered 2016-03-17: 11:00:00 via INTRAVENOUS
  Filled 2016-03-17: qty 4

## 2016-03-17 NOTE — Progress Notes (Signed)
  [place] OFFICE PROGRESS NOTE   Diagnosis: Colon cancer  INTERVAL HISTORY:   Brittney [name] completed another cycle of FOLFOX 3 07/21/2016. She tolerated Therapy well. Mild nausea followed chemotherapy. She has intermittent cold sensitivity. No other neuropathy symptoms. No diarrhea. Her blood pressure has been normal when checked on multiple occasions at a pharmacy.  Objective:  Vital signs in last 24 hours:  Blood pressure 170/98, pulse 95, temperature 98.2 F (36.8 C), temperature source Oral, resp. rate 19, height '5\' 1"'$  (1.549 m), weight 146 lb 4.8 oz (66.361 kg), last menstrual period 07/28/2012, SpO2 100 %.    HEENT: No thrush or ulcers Resp: Lungs clear bilaterally Cardio: Regular rate and rhythm GI: No hepatomegaly, nontender Vascular: No leg edema Neuro: Mild decrease in vibratory sense at the fingertips bilaterally  Skin: Hyperpigmentation of the hands   Portacath/PICC-without erythema  Lab Results:  Lab Results  Component Value Date   WBC 4.5 03/17/2016   HGB 14.1 03/17/2016   HCT 42.5 03/17/2016   MCV 94.8 03/17/2016   PLT 229 03/17/2016   NEUTROABS 2.7 03/17/2016     Medications: I have reviewed the patient's current medications.  Assessment/Plan: 1. 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 Hypertension 3. G3 P3 4. Indeterminate bilateral pulmonary nodules on the staging chest CT  09/11/2015 5. Port-A-Cath placement by Dr. Barry Dienes 11/26/2015 6. Mild oxaliplatin neuropathy    Disposition:  She continues to tolerate the chemotherapy well. The plan is to proceed with cycle 8 FOLFOX today. She will return for an office visit and chemotherapy in 2 weeks.  [name], MD  03/17/2016  9:50 AM

## 2016-03-17 NOTE — Progress Notes (Signed)
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

## 2016-03-17 NOTE — Patient Instructions (Signed)
[place] Discharge Instructions for Patients Receiving Chemotherapy  Today you received the following chemotherapy agents: Oxaliplatin, leucovorin, 5FU.  To help prevent nausea and vomiting after your treatment, we encourage you to take your nausea medication:  Compazine 10 mg every 6 hours as needed.   If you develop nausea and vomiting that is not controlled by your nausea medication, call the clinic.   BELOW ARE SYMPTOMS THAT SHOULD BE REPORTED IMMEDIATELY:  *FEVER GREATER THAN 100.5 F  *CHILLS WITH OR WITHOUT FEVER  NAUSEA AND VOMITING THAT IS NOT CONTROLLED WITH YOUR NAUSEA MEDICATION  *UNUSUAL SHORTNESS OF BREATH  *UNUSUAL BRUISING OR BLEEDING  TENDERNESS IN MOUTH AND THROAT WITH OR WITHOUT PRESENCE OF ULCERS  *URINARY PROBLEMS  *BOWEL PROBLEMS  UNUSUAL RASH Items with * indicate a potential emergency and should be followed up as soon as possible.  Feel free to call the clinic you have any questions or concerns. The clinic phone number is [phone].  Please show the [place] at check-in to the Emergency Department and triage nurse.

## 2016-03-17 NOTE — Telephone Encounter (Signed)
per pof to sch pt appt-gave pt copy of avs °

## 2016-03-17 NOTE — Patient Instructions (Signed)

## 2016-03-19 ENCOUNTER — Ambulatory Visit (HOSPITAL_BASED_OUTPATIENT_CLINIC_OR_DEPARTMENT_OTHER): Payer: BLUE CROSS/BLUE SHIELD

## 2016-03-19 VITALS — BP 166/88 | HR 85 | Temp 98.5°F | Resp 16

## 2016-03-19 DIAGNOSIS — C187 Malignant neoplasm of sigmoid colon: Secondary | ICD-10-CM | POA: Diagnosis not present

## 2016-03-19 DIAGNOSIS — C779 Secondary and unspecified malignant neoplasm of lymph node, unspecified: Secondary | ICD-10-CM | POA: Diagnosis not present

## 2016-03-19 MED ORDER — HEPARIN SOD (PORK) LOCK FLUSH 100 UNIT/ML IV SOLN
500.0000 [IU] | Freq: Once | INTRAVENOUS | Status: AC | PRN
  Administered 2016-03-19: 500 [IU]
  Filled 2016-03-19: qty 5

## 2016-03-19 MED ORDER — SODIUM CHLORIDE 0.9 % IJ SOLN
10.0000 mL | INTRAMUSCULAR | Status: DC | PRN
  Administered 2016-03-19: 10 mL
  Filled 2016-03-19: qty 10

## 2016-03-19 NOTE — Progress Notes (Signed)
Patient here with Spanish Interpreter.  Patient declined AVS

## 2016-03-23 ENCOUNTER — Encounter: Payer: Self-pay | Admitting: Oncology

## 2016-03-23 NOTE — Progress Notes (Signed)
See prev notes..correction of date fax was sent 01/14/16

## 2016-03-23 NOTE — Progress Notes (Signed)
Fax sent 07/08/15 I sent to medical records

## 2016-03-23 NOTE — Progress Notes (Signed)
fmla faxed to [facility] 12/03/15  11/26/15 I sent to medical records

## 2016-03-31 ENCOUNTER — Other Ambulatory Visit: Payer: BLUE CROSS/BLUE SHIELD

## 2016-03-31 ENCOUNTER — Other Ambulatory Visit: Payer: Self-pay | Admitting: Oncology

## 2016-03-31 ENCOUNTER — Telehealth: Payer: Self-pay | Admitting: *Deleted

## 2016-03-31 ENCOUNTER — Other Ambulatory Visit (HOSPITAL_BASED_OUTPATIENT_CLINIC_OR_DEPARTMENT_OTHER): Payer: BLUE CROSS/BLUE SHIELD

## 2016-03-31 ENCOUNTER — Ambulatory Visit: Payer: BLUE CROSS/BLUE SHIELD

## 2016-03-31 ENCOUNTER — Ambulatory Visit (HOSPITAL_BASED_OUTPATIENT_CLINIC_OR_DEPARTMENT_OTHER): Payer: BLUE CROSS/BLUE SHIELD | Admitting: Oncology

## 2016-03-31 ENCOUNTER — Telehealth: Payer: Self-pay | Admitting: Oncology

## 2016-03-31 ENCOUNTER — Encounter: Payer: Self-pay | Admitting: *Deleted

## 2016-03-31 ENCOUNTER — Ambulatory Visit (HOSPITAL_BASED_OUTPATIENT_CLINIC_OR_DEPARTMENT_OTHER): Payer: BLUE CROSS/BLUE SHIELD

## 2016-03-31 VITALS — BP 172/87 | HR 67

## 2016-03-31 VITALS — BP 171/93 | HR 85 | Temp 99.0°F | Resp 18 | Ht 61.0 in | Wt 148.8 lb

## 2016-03-31 DIAGNOSIS — I10 Essential (primary) hypertension: Secondary | ICD-10-CM

## 2016-03-31 DIAGNOSIS — G620 Drug-induced polyneuropathy: Secondary | ICD-10-CM

## 2016-03-31 DIAGNOSIS — R918 Other nonspecific abnormal finding of lung field: Secondary | ICD-10-CM | POA: Diagnosis not present

## 2016-03-31 DIAGNOSIS — Z5111 Encounter for antineoplastic chemotherapy: Secondary | ICD-10-CM | POA: Diagnosis not present

## 2016-03-31 DIAGNOSIS — C779 Secondary and unspecified malignant neoplasm of lymph node, unspecified: Secondary | ICD-10-CM | POA: Diagnosis not present

## 2016-03-31 DIAGNOSIS — Z95828 Presence of other vascular implants and grafts: Secondary | ICD-10-CM

## 2016-03-31 DIAGNOSIS — C187 Malignant neoplasm of sigmoid colon: Secondary | ICD-10-CM | POA: Diagnosis not present

## 2016-03-31 LAB — CBC WITH DIFFERENTIAL/PLATELET
BASO%: 0.9 % (ref 0.0–2.0)
BASOS ABS: 0 10*3/uL (ref 0.0–0.1)
EOS%: 3.1 % (ref 0.0–7.0)
Eosinophils Absolute: 0.1 10*3/uL (ref 0.0–0.5)
HCT: 39.7 % (ref 34.8–46.6)
HEMOGLOBIN: 13.5 g/dL (ref 11.6–15.9)
LYMPH%: 27.7 % (ref 14.0–49.7)
MCH: 32 pg (ref 25.1–34.0)
MCHC: 34 g/dL (ref 31.5–36.0)
MCV: 94.1 fL (ref 79.5–101.0)
MONO#: 0.4 10*3/uL (ref 0.1–0.9)
MONO%: 9.2 % (ref 0.0–14.0)
NEUT%: 59.1 % (ref 38.4–76.8)
NEUTROS ABS: 2.7 10*3/uL (ref 1.5–6.5)
Platelets: 139 10*3/uL — ABNORMAL LOW (ref 145–400)
RBC: 4.22 10*6/uL (ref 3.70–5.45)
RDW: 15.8 % — AB (ref 11.2–14.5)
WBC: 4.6 10*3/uL (ref 3.9–10.3)
lymph#: 1.3 10*3/uL (ref 0.9–3.3)

## 2016-03-31 LAB — COMPREHENSIVE METABOLIC PANEL
ALBUMIN: 3.7 g/dL (ref 3.5–5.0)
ALK PHOS: 108 U/L (ref 40–150)
ALT: 25 U/L (ref 0–55)
AST: 25 U/L (ref 5–34)
Anion Gap: 8 mEq/L (ref 3–11)
BUN: 11.1 mg/dL (ref 7.0–26.0)
CO2: 23 meq/L (ref 22–29)
Calcium: 9.6 mg/dL (ref 8.4–10.4)
Chloride: 110 mEq/L — ABNORMAL HIGH (ref 98–109)
Creatinine: 0.8 mg/dL (ref 0.6–1.1)
EGFR: 88 mL/min/{1.73_m2} — ABNORMAL LOW (ref >90)
GLUCOSE: 115 mg/dL (ref 70–140)
POTASSIUM: 3.8 meq/L (ref 3.5–5.1)
SODIUM: 142 meq/L (ref 136–145)
Total Bilirubin: <0.3 mg/dL (ref 0.20–1.20)
Total Protein: 7.1 g/dL (ref 6.4–8.3)

## 2016-03-31 MED ORDER — SODIUM CHLORIDE 0.9 % IV SOLN
2400.0000 mg/m2 | INTRAVENOUS | Status: DC
  Administered 2016-03-31: 4000 mg via INTRAVENOUS
  Filled 2016-03-31: qty 80

## 2016-03-31 MED ORDER — FLUOROURACIL CHEMO INJECTION 2.5 GM/50ML
400.0000 mg/m2 | Freq: Once | INTRAVENOUS | Status: AC
  Administered 2016-03-31: 650 mg via INTRAVENOUS
  Filled 2016-03-31: qty 13

## 2016-03-31 MED ORDER — DEXTROSE 5 % IV SOLN
Freq: Once | INTRAVENOUS | Status: AC
  Administered 2016-03-31: 11:00:00 via INTRAVENOUS

## 2016-03-31 MED ORDER — SODIUM CHLORIDE 0.9% FLUSH
10.0000 mL | INTRAVENOUS | Status: DC | PRN
  Administered 2016-03-31: 10 mL via INTRAVENOUS
  Filled 2016-03-31: qty 10

## 2016-03-31 MED ORDER — OXALIPLATIN CHEMO INJECTION 100 MG/20ML
85.0000 mg/m2 | Freq: Once | INTRAVENOUS | Status: AC
  Administered 2016-03-31: 140 mg via INTRAVENOUS
  Filled 2016-03-31: qty 8

## 2016-03-31 MED ORDER — SODIUM CHLORIDE 0.9 % IV SOLN
Freq: Once | INTRAVENOUS | Status: AC
  Administered 2016-03-31: 11:00:00 via INTRAVENOUS
  Filled 2016-03-31: qty 4

## 2016-03-31 MED ORDER — LEUCOVORIN CALCIUM INJECTION 350 MG
400.0000 mg/m2 | Freq: Once | INTRAVENOUS | Status: AC
  Administered 2016-03-31: 664 mg via INTRAVENOUS
  Filled 2016-03-31: qty 33.2

## 2016-03-31 NOTE — Progress Notes (Signed)
Oncology Nurse Navigator Documentation  Oncology Nurse Navigator Flowsheets 03/31/2016  Navigator Location [facility]  Navigator Encounter Type Treatment  Abnormal Finding Date -  Confirmed Diagnosis Date -  Surgery Date -  Treatment Initiated Date -  Patient Visit Type MedOnc  Treatment Phase Active Tx--FOFLOX #9  Barriers/Navigation Needs Coordination of Care--work excuse  Education -  Interventions Coordination of Care--provided letter for work w/MD Teacher, adult education Method -  Support Groups/Services -  Acuity -  Time Spent with Patient 15  No other navigation needs. She is doing very well with her chemotherapy. Per MD, next CT scan will be 1 year from surgery date.

## 2016-03-31 NOTE — Telephone Encounter (Signed)
per pof to sch pt appt-sent MW email to sch trmt-pt tog et updated copy of avs b4 leaving °

## 2016-03-31 NOTE — Progress Notes (Signed)
  [place] OFFICE PROGRESS NOTE   Diagnosis: Colon cancer  INTERVAL HISTORY:   Brittney [name] returns as scheduled. She completed another cycle of FOLFOX 03/17/2016. No nausea or diarrhea. She has occasional tingling in the hands with cold exposure. No other neuropathy symptoms. No difficulty with activities.  Objective:  Vital signs in last 24 hours:  Blood pressure 171/93, pulse 85, temperature 99 F (37.2 C), temperature source Oral, resp. rate 18, height _0  (1.549 m), weight 148 lb 12.8 oz (67.495 kg), last menstrual period 07/28/2012, SpO2 100 %.    HEENT: No thrush or ulcers  Resp: Lungs clear bilaterally Cardio: Regular rate and rhythm GI: No hepatomegaly, nontender Vascular: No leg edema Neuro: Moderate loss of vibratory sense at the fingertips bilaterally  Skin: Hyperpigmentation of the hands   Portacath/PICC-without erythema  Lab Results:  Lab Results  Component Value Date   WBC 4.6 03/31/2016   HGB 13.5 03/31/2016   HCT 39.7 03/31/2016   MCV 94.1 03/31/2016   PLT 139* 03/31/2016   NEUTROABS 2.7 03/31/2016     Medications: I have reviewed the patient's current medications.  Assessment/Plan: 1. 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, status post a robotic sigmoid colectomy 10/29/2015  1 of 16 lymph nodes positive for metastatic adenocarcinoma, lymphovascular invasion present  Microsatellite stable, no loss of mismatch repair protein expression  Cycle 1 adjuvant FOLFOX 12/03/2015  Cycle 2 adjuvant FOLFOX 12/17/2015  Cycle 3 adjuvant FOLFOX 12/31/2015  Cycle 4 adjuvant FOLFOX 01/14/2016  Cycle 5 adjuvant FOLFOX 01/28/2016  Cycle 6 adjuvant FOLFOX 02/11/2016  Cycle 7 adjuvant FOLFOX 02/25/2016  Cycle 8 adjuvant FOLFOX 03/17/2016  Cycle 9 adjuvant FOLFOX 03/31/2016 2. Hypertension 3. G3 P3 4. Indeterminate bilateral pulmonary nodules on the staging chest CT 09/11/2015 5. Port-A-Cath  placement by Dr. Barry Dienes 11/26/2015 6. oxaliplatin neuropathy     Disposition:  Brittney [name] appears well. The plan is to continue adjuvant FOLFOX chemotherapy. She has developed moderate loss of vibratory sense at the fingertips that does not interfere with activity at present. We will discontinue oxaliplatin from the chemotherapy regimen if the vibratory loss progresses. She will return for an office visit and chemotherapy in 2 weeks.  Brittney Coder, MD  03/31/2016  9:28 AM

## 2016-03-31 NOTE — Patient Instructions (Signed)
[place] Discharge Instructions for Patients Receiving Chemotherapy  Today you received the following chemotherapy agents;  Leucovorin, Oxaliplatin and Fluorouracil.   To help prevent nausea and vomiting after your treatment, we encourage you to take your nausea medication as directed.    If you develop nausea and vomiting that is not controlled by your nausea medication, call the clinic.   BELOW ARE SYMPTOMS THAT SHOULD BE REPORTED IMMEDIATELY:  *FEVER GREATER THAN 100.5 F  *CHILLS WITH OR WITHOUT FEVER  NAUSEA AND VOMITING THAT IS NOT CONTROLLED WITH YOUR NAUSEA MEDICATION  *UNUSUAL SHORTNESS OF BREATH  *UNUSUAL BRUISING OR BLEEDING  TENDERNESS IN MOUTH AND THROAT WITH OR WITHOUT PRESENCE OF ULCERS  *URINARY PROBLEMS  *BOWEL PROBLEMS  UNUSUAL RASH Items with * indicate a potential emergency and should be followed up as soon as possible.  Feel free to call the clinic you have any questions or concerns. The clinic phone number is [phone].  Please show the [place] at check-in to the Emergency Department and triage nurse.

## 2016-03-31 NOTE — Patient Instructions (Signed)

## 2016-03-31 NOTE — Telephone Encounter (Signed)
Per staff message and POF I have scheduled appts. Advised scheduler of appts. JMW  

## 2016-04-02 ENCOUNTER — Ambulatory Visit (HOSPITAL_BASED_OUTPATIENT_CLINIC_OR_DEPARTMENT_OTHER): Payer: BLUE CROSS/BLUE SHIELD

## 2016-04-02 DIAGNOSIS — C187 Malignant neoplasm of sigmoid colon: Secondary | ICD-10-CM | POA: Diagnosis not present

## 2016-04-02 MED ORDER — SODIUM CHLORIDE 0.9 % IJ SOLN
10.0000 mL | INTRAMUSCULAR | Status: DC | PRN
  Administered 2016-04-02: 10 mL
  Filled 2016-04-02: qty 10

## 2016-04-02 MED ORDER — HEPARIN SOD (PORK) LOCK FLUSH 100 UNIT/ML IV SOLN
500.0000 [IU] | Freq: Once | INTRAVENOUS | Status: AC | PRN
  Administered 2016-04-02: 500 [IU]
  Filled 2016-04-02: qty 5

## 2016-04-02 NOTE — Patient Instructions (Signed)

## 2016-04-11 ENCOUNTER — Other Ambulatory Visit: Payer: Self-pay | Admitting: Oncology

## 2016-04-14 ENCOUNTER — Ambulatory Visit (HOSPITAL_BASED_OUTPATIENT_CLINIC_OR_DEPARTMENT_OTHER): Payer: BLUE CROSS/BLUE SHIELD | Admitting: Nurse Practitioner

## 2016-04-14 ENCOUNTER — Other Ambulatory Visit: Payer: Self-pay

## 2016-04-14 ENCOUNTER — Other Ambulatory Visit (HOSPITAL_BASED_OUTPATIENT_CLINIC_OR_DEPARTMENT_OTHER): Payer: BLUE CROSS/BLUE SHIELD

## 2016-04-14 ENCOUNTER — Ambulatory Visit (HOSPITAL_BASED_OUTPATIENT_CLINIC_OR_DEPARTMENT_OTHER): Payer: BLUE CROSS/BLUE SHIELD

## 2016-04-14 ENCOUNTER — Telehealth: Payer: Self-pay | Admitting: Nurse Practitioner

## 2016-04-14 ENCOUNTER — Ambulatory Visit: Payer: BLUE CROSS/BLUE SHIELD

## 2016-04-14 ENCOUNTER — Telehealth: Payer: Self-pay | Admitting: *Deleted

## 2016-04-14 VITALS — BP 151/85 | HR 85 | Temp 98.5°F | Resp 18 | Ht 61.0 in | Wt 148.0 lb

## 2016-04-14 DIAGNOSIS — C187 Malignant neoplasm of sigmoid colon: Secondary | ICD-10-CM

## 2016-04-14 DIAGNOSIS — Z95828 Presence of other vascular implants and grafts: Secondary | ICD-10-CM

## 2016-04-14 DIAGNOSIS — G620 Drug-induced polyneuropathy: Secondary | ICD-10-CM | POA: Diagnosis not present

## 2016-04-14 DIAGNOSIS — Z5111 Encounter for antineoplastic chemotherapy: Secondary | ICD-10-CM | POA: Diagnosis not present

## 2016-04-14 DIAGNOSIS — R918 Other nonspecific abnormal finding of lung field: Secondary | ICD-10-CM | POA: Diagnosis not present

## 2016-04-14 DIAGNOSIS — C779 Secondary and unspecified malignant neoplasm of lymph node, unspecified: Secondary | ICD-10-CM | POA: Diagnosis not present

## 2016-04-14 LAB — CBC WITH DIFFERENTIAL/PLATELET
BASO%: 0.5 % (ref 0.0–2.0)
Basophils Absolute: 0 10*3/uL (ref 0.0–0.1)
EOS%: 1.2 % (ref 0.0–7.0)
Eosinophils Absolute: 0.1 10*3/uL (ref 0.0–0.5)
HCT: 39.8 % (ref 34.8–46.6)
HEMOGLOBIN: 13.6 g/dL (ref 11.6–15.9)
LYMPH#: 1.1 10*3/uL (ref 0.9–3.3)
LYMPH%: 26.1 % (ref 14.0–49.7)
MCH: 32.1 pg (ref 25.1–34.0)
MCHC: 34.2 g/dL (ref 31.5–36.0)
MCV: 93.9 fL (ref 79.5–101.0)
MONO#: 0.3 10*3/uL (ref 0.1–0.9)
MONO%: 7.2 % (ref 0.0–14.0)
NEUT%: 65 % (ref 38.4–76.8)
NEUTROS ABS: 2.7 10*3/uL (ref 1.5–6.5)
PLATELETS: 143 10*3/uL — AB (ref 145–400)
RBC: 4.24 10*6/uL (ref 3.70–5.45)
RDW: 16 % — AB (ref 11.2–14.5)
WBC: 4.2 10*3/uL (ref 3.9–10.3)

## 2016-04-14 LAB — COMPREHENSIVE METABOLIC PANEL
ALBUMIN: 3.8 g/dL (ref 3.5–5.0)
ALT: 29 U/L (ref 0–55)
ANION GAP: 9 meq/L (ref 3–11)
AST: 29 U/L (ref 5–34)
Alkaline Phosphatase: 106 U/L (ref 40–150)
BILIRUBIN TOTAL: 0.37 mg/dL (ref 0.20–1.20)
BUN: 12.6 mg/dL (ref 7.0–26.0)
CO2: 24 meq/L (ref 22–29)
CREATININE: 0.8 mg/dL (ref 0.6–1.1)
Calcium: 9.6 mg/dL (ref 8.4–10.4)
Chloride: 110 mEq/L — ABNORMAL HIGH (ref 98–109)
EGFR: 86 mL/min/{1.73_m2} — ABNORMAL LOW (ref >90)
GLUCOSE: 155 mg/dL — AB (ref 70–140)
Potassium: 3.6 mEq/L (ref 3.5–5.1)
Sodium: 142 mEq/L (ref 136–145)
TOTAL PROTEIN: 7.2 g/dL (ref 6.4–8.3)

## 2016-04-14 MED ORDER — FLUOROURACIL CHEMO INJECTION 2.5 GM/50ML
400.0000 mg/m2 | Freq: Once | INTRAVENOUS | Status: AC
  Administered 2016-04-14: 650 mg via INTRAVENOUS
  Filled 2016-04-14: qty 13

## 2016-04-14 MED ORDER — SODIUM CHLORIDE 0.9 % IV SOLN
2400.0000 mg/m2 | INTRAVENOUS | Status: DC
  Administered 2016-04-14: 4000 mg via INTRAVENOUS
  Filled 2016-04-14: qty 80

## 2016-04-14 MED ORDER — SODIUM CHLORIDE 0.9 % IJ SOLN
10.0000 mL | INTRAMUSCULAR | Status: DC | PRN
  Administered 2016-04-14: 10 mL via INTRAVENOUS
  Filled 2016-04-14: qty 10

## 2016-04-14 MED ORDER — LEUCOVORIN CALCIUM INJECTION 350 MG
400.0000 mg/m2 | Freq: Once | INTRAMUSCULAR | Status: AC
  Administered 2016-04-14: 664 mg via INTRAVENOUS
  Filled 2016-04-14: qty 33.2

## 2016-04-14 MED ORDER — SODIUM CHLORIDE 0.9 % IV SOLN
INTRAVENOUS | Status: DC
  Administered 2016-04-14: 12:00:00 via INTRAVENOUS

## 2016-04-14 NOTE — Telephone Encounter (Signed)
Per staff message and POF I have scheduled appts. Advised scheduler of appts. JMW  

## 2016-04-14 NOTE — Progress Notes (Signed)
  [place] OFFICE PROGRESS NOTE   Diagnosis:  Colon cancer  INTERVAL HISTORY:   Brittney [name] returns as scheduled. She completed cycle 9 FOLFOX 03/31/2016. She denies nausea/vomiting. No mouth sores. No diarrhea. She notes mild numbness in 3 fingertips on the right hand. The numbness does not interfere with activity.  Objective:  Vital signs in last 24 hours:  Blood pressure 151/85, pulse 85, temperature 98.5 F (36.9 C), temperature source Oral, resp. rate 18, height '5\' 1"'$  (1.549 m), weight 148 lb (67.132 kg), last menstrual period 07/28/2012, SpO2 100 %.    HEENT: No thrush or ulcers. Resp: Lungs clear bilaterally. Cardio: Regular rate and rhythm. GI: Abdomen soft and nontender. No hepatomegaly. Vascular: No leg edema. Neuro: Moderate to severe decrease in vibratory sense over the fingertips on the right hand; moderate decrease in vibratory sense over the fingertips on the left hand.  Skin: Palms without erythema. Port-A-Cath without erythema.    Lab Results:  Lab Results  Component Value Date   WBC 4.2 04/14/2016   HGB 13.6 04/14/2016   HCT 39.8 04/14/2016   MCV 93.9 04/14/2016   PLT 143* 04/14/2016   NEUTROABS 2.7 04/14/2016    Imaging:  No results found.  Medications: I have reviewed the patient's current medications.  Assessment/Plan: 1. 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, status post a robotic sigmoid colectomy 10/29/2015  1 of 16 lymph nodes positive for metastatic adenocarcinoma, lymphovascular invasion present  Microsatellite stable, no loss of mismatch repair protein expression  Cycle 1 adjuvant FOLFOX 12/03/2015  Cycle 2 adjuvant FOLFOX 12/17/2015  Cycle 3 adjuvant FOLFOX 12/31/2015  Cycle 4 adjuvant FOLFOX 01/14/2016  Cycle 5 adjuvant FOLFOX 01/28/2016  Cycle 6 adjuvant FOLFOX 02/11/2016  Cycle 7 adjuvant FOLFOX 02/25/2016  Cycle 8 adjuvant FOLFOX 03/17/2016  Cycle 9 adjuvant  FOLFOX 03/31/2016 2. Hypertension 3. G3 P3 4. Indeterminate bilateral pulmonary nodules on the staging chest CT 09/11/2015 5. Port-A-Cath placement by Brittney [name] 11/26/2015 6. Oxaliplatin neuropathy   Disposition: Brittney [name] appears stable. She has completed 9 cycles of adjuvant FOLFOX. She has progressive neuropathy symptoms involving the hands and has moderate to severe loss of vibratory sense at the fingertips. The plan is to proceed with cycle 10 FOLFOX today as scheduled without oxaliplatin. She will return for a follow-up visit and cycle 11 in 2 weeks. She will contact the office in the interim with any problems.  Plan reviewed with Brittney [name].    Brittney [name] ANP/GNP-BC   04/14/2016  10:33 AM

## 2016-04-14 NOTE — Telephone Encounter (Signed)
per pof to sch pt appt-sent MW email to sch pt trmt-pt to get updated copy b4 leaving °

## 2016-04-14 NOTE — Patient Instructions (Signed)

## 2016-04-14 NOTE — Patient Instructions (Signed)
[facility] Discharge Instructions for Patients Receiving Chemotherapy  Today you received the following chemotherapy agents: Leucovorin/5 FU To help prevent nausea and vomiting after your treatment, we encourage you to take your nausea medication as prescribed.   If you develop nausea and vomiting that is not controlled by your nausea medication, call the clinic.   BELOW ARE SYMPTOMS THAT SHOULD BE REPORTED IMMEDIATELY:  *FEVER GREATER THAN 100.5 F  *CHILLS WITH OR WITHOUT FEVER  NAUSEA AND VOMITING THAT IS NOT CONTROLLED WITH YOUR NAUSEA MEDICATION  *UNUSUAL SHORTNESS OF BREATH  *UNUSUAL BRUISING OR BLEEDING  TENDERNESS IN MOUTH AND THROAT WITH OR WITHOUT PRESENCE OF ULCERS  *URINARY PROBLEMS  *BOWEL PROBLEMS  UNUSUAL RASH Items with * indicate a potential emergency and should be followed up as soon as possible.  Feel free to call the clinic you have any questions or concerns. The clinic phone number is [phone].  Please show the CHEMO ALERT CARD at check-in to the Emergency Department and triage nurse.   

## 2016-04-16 ENCOUNTER — Ambulatory Visit (HOSPITAL_BASED_OUTPATIENT_CLINIC_OR_DEPARTMENT_OTHER): Payer: BLUE CROSS/BLUE SHIELD

## 2016-04-16 VITALS — BP 170/90 | HR 83 | Temp 98.6°F | Resp 18

## 2016-04-16 DIAGNOSIS — C187 Malignant neoplasm of sigmoid colon: Secondary | ICD-10-CM

## 2016-04-16 DIAGNOSIS — C779 Secondary and unspecified malignant neoplasm of lymph node, unspecified: Secondary | ICD-10-CM

## 2016-04-16 MED ORDER — SODIUM CHLORIDE 0.9 % IJ SOLN
10.0000 mL | INTRAMUSCULAR | Status: DC | PRN
  Administered 2016-04-16: 10 mL
  Filled 2016-04-16: qty 10

## 2016-04-16 MED ORDER — HEPARIN SOD (PORK) LOCK FLUSH 100 UNIT/ML IV SOLN
500.0000 [IU] | Freq: Once | INTRAVENOUS | Status: AC | PRN
  Administered 2016-04-16: 500 [IU]
  Filled 2016-04-16: qty 5

## 2016-04-24 ENCOUNTER — Other Ambulatory Visit: Payer: Self-pay | Admitting: Oncology

## 2016-04-28 ENCOUNTER — Other Ambulatory Visit (HOSPITAL_BASED_OUTPATIENT_CLINIC_OR_DEPARTMENT_OTHER): Payer: BLUE CROSS/BLUE SHIELD

## 2016-04-28 ENCOUNTER — Ambulatory Visit (HOSPITAL_BASED_OUTPATIENT_CLINIC_OR_DEPARTMENT_OTHER): Payer: BLUE CROSS/BLUE SHIELD

## 2016-04-28 ENCOUNTER — Ambulatory Visit (HOSPITAL_BASED_OUTPATIENT_CLINIC_OR_DEPARTMENT_OTHER): Payer: BLUE CROSS/BLUE SHIELD | Admitting: Oncology

## 2016-04-28 ENCOUNTER — Encounter: Payer: Self-pay | Admitting: *Deleted

## 2016-04-28 ENCOUNTER — Ambulatory Visit: Payer: BLUE CROSS/BLUE SHIELD

## 2016-04-28 VITALS — BP 151/81 | HR 79 | Resp 18

## 2016-04-28 VITALS — BP 194/93 | HR 87 | Temp 98.3°F | Resp 16 | Ht 61.0 in | Wt 150.8 lb

## 2016-04-28 DIAGNOSIS — C187 Malignant neoplasm of sigmoid colon: Secondary | ICD-10-CM

## 2016-04-28 DIAGNOSIS — G620 Drug-induced polyneuropathy: Secondary | ICD-10-CM

## 2016-04-28 DIAGNOSIS — R918 Other nonspecific abnormal finding of lung field: Secondary | ICD-10-CM

## 2016-04-28 DIAGNOSIS — C779 Secondary and unspecified malignant neoplasm of lymph node, unspecified: Secondary | ICD-10-CM

## 2016-04-28 DIAGNOSIS — Z5111 Encounter for antineoplastic chemotherapy: Secondary | ICD-10-CM

## 2016-04-28 DIAGNOSIS — Z95828 Presence of other vascular implants and grafts: Secondary | ICD-10-CM

## 2016-04-28 LAB — COMPREHENSIVE METABOLIC PANEL
ALBUMIN: 3.8 g/dL (ref 3.5–5.0)
ALK PHOS: 107 U/L (ref 40–150)
ALT: 28 U/L (ref 0–55)
ANION GAP: 9 meq/L (ref 3–11)
AST: 23 U/L (ref 5–34)
BUN: 14 mg/dL (ref 7.0–26.0)
CALCIUM: 9.7 mg/dL (ref 8.4–10.4)
CHLORIDE: 109 meq/L (ref 98–109)
CO2: 23 mEq/L (ref 22–29)
CREATININE: 0.9 mg/dL (ref 0.6–1.1)
EGFR: 76 mL/min/{1.73_m2} — ABNORMAL LOW (ref >90)
Glucose: 155 mg/dl — ABNORMAL HIGH (ref 70–140)
POTASSIUM: 3.7 meq/L (ref 3.5–5.1)
Sodium: 141 mEq/L (ref 136–145)
Total Bilirubin: 0.42 mg/dL (ref 0.20–1.20)
Total Protein: 7.3 g/dL (ref 6.4–8.3)

## 2016-04-28 LAB — CBC WITH DIFFERENTIAL/PLATELET
BASO%: 1.5 % (ref 0.0–2.0)
BASOS ABS: 0.1 10*3/uL (ref 0.0–0.1)
EOS ABS: 0.1 10*3/uL (ref 0.0–0.5)
EOS%: 1.8 % (ref 0.0–7.0)
HEMATOCRIT: 41.4 % (ref 34.8–46.6)
HEMOGLOBIN: 13.9 g/dL (ref 11.6–15.9)
LYMPH#: 1.2 10*3/uL (ref 0.9–3.3)
LYMPH%: 25.9 % (ref 14.0–49.7)
MCH: 32 pg (ref 25.1–34.0)
MCHC: 33.6 g/dL (ref 31.5–36.0)
MCV: 95.3 fL (ref 79.5–101.0)
MONO#: 0.5 10*3/uL (ref 0.1–0.9)
MONO%: 9.7 % (ref 0.0–14.0)
NEUT#: 2.9 10*3/uL (ref 1.5–6.5)
NEUT%: 61.1 % (ref 38.4–76.8)
PLATELETS: 146 10*3/uL (ref 145–400)
RBC: 4.35 10*6/uL (ref 3.70–5.45)
RDW: 17.2 % — AB (ref 11.2–14.5)
WBC: 4.7 10*3/uL (ref 3.9–10.3)

## 2016-04-28 MED ORDER — DEXTROSE 5 % IV SOLN
400.0000 mg/m2 | Freq: Once | INTRAVENOUS | Status: AC
  Administered 2016-04-28: 664 mg via INTRAVENOUS
  Filled 2016-04-28: qty 33.2

## 2016-04-28 MED ORDER — DEXTROSE 5 % IV SOLN
Freq: Once | INTRAVENOUS | Status: AC
  Administered 2016-04-28: 10:00:00 via INTRAVENOUS

## 2016-04-28 MED ORDER — SODIUM CHLORIDE 0.9 % IV SOLN
2400.0000 mg/m2 | INTRAVENOUS | Status: DC
  Administered 2016-04-28: 4000 mg via INTRAVENOUS
  Filled 2016-04-28: qty 80

## 2016-04-28 MED ORDER — OXALIPLATIN CHEMO INJECTION 100 MG/20ML: 85.0000 mg/m2 | Freq: Once | INTRAVENOUS | Status: DC

## 2016-04-28 MED ORDER — FLUOROURACIL CHEMO INJECTION 2.5 GM/50ML
400.0000 mg/m2 | Freq: Once | INTRAVENOUS | Status: AC
  Administered 2016-04-28: 650 mg via INTRAVENOUS
  Filled 2016-04-28: qty 13

## 2016-04-28 MED ORDER — SODIUM CHLORIDE 0.9 % IJ SOLN
10.0000 mL | INTRAMUSCULAR | Status: DC | PRN
  Administered 2016-04-28: 10 mL via INTRAVENOUS
  Filled 2016-04-28: qty 10

## 2016-04-28 MED ORDER — SODIUM CHLORIDE 0.9 % IV SOLN: Freq: Once | INTRAVENOUS | Status: DC

## 2016-04-28 NOTE — Progress Notes (Signed)
  [place] OFFICE PROGRESS NOTE   Diagnosis: Colon cancer  INTERVAL HISTORY:   Brittney [name] returns as scheduled. She completed another cycle of FOLFOX 04/14/2016. No nausea or diarrhea following chemotherapy. She reports improvement in numbness at 2 fingers of the right hand. No other numbness.  Objective:  Vital signs in last 24 hours:  Blood pressure 194/93, pulse 87, temperature 98.3 F (36.8 C), temperature source Oral, resp. rate 16, height '5\' 1"'$  (1.549 m), weight 150 lb 12.8 oz (68.402 kg), last menstrual period 07/28/2012, SpO2 100 %.    HEENT: No thrush or ulcers Resp: Lungs clear bilaterally Cardio: Regular rate and rhythm GI: No hepatomegaly, nontender Vascular: No leg edema Neuro: Moderate loss of vibratory sense at the fingertips bilaterally  Skin: Mild hyperpigmentation of the hands   Portacath/PICC-without erythema  Lab Results:  Lab Results  Component Value Date   WBC 4.2 04/14/2016   HGB 13.6 04/14/2016   HCT 39.8 04/14/2016   MCV 93.9 04/14/2016   PLT 143* 04/14/2016   NEUTROABS 2.7 04/14/2016     Medications: I have reviewed the patient's current medications.  Assessment/Plan: 1. 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, status post a robotic sigmoid colectomy 10/29/2015  1 of 16 lymph nodes positive for metastatic adenocarcinoma, lymphovascular invasion present  Microsatellite stable, no loss of mismatch repair protein expression  Cycle 1 adjuvant FOLFOX 12/03/2015  Cycle 2 adjuvant FOLFOX 12/17/2015  Cycle 3 adjuvant FOLFOX adjuvant FOLFOX 01/14/2016  Cycle 5 adjuvant FOLFOX 01/28/2016  Cycle 6 adjuvant FOLFOX 02/11/2016  Cycle 7 adjuvant FOLFOX 02/25/2016  Cycle 8 adjuvant FOLFOX 03/17/2016  Cycle 9 adjuvant FOLFOX 03/31/2016  Cycle 10 adjuvant FOLFOX 04/14/2016  Cycle 11 adjuvant FOLFOX 04/28/2016 (oxaliplatin deleted) 2. Hypertension 3. G3 P3 4. Indeterminate bilateral  pulmonary nodules on the staging chest CT 09/11/2015 5. Port-A-Cath placement by Dr. Barry Dienes 11/26/2015 6. Oxaliplatin neuropathy-progressive 04/28/2016   Disposition:  She has developed progressive oxaliplatin neuropathy. We will delete the oxaliplatin. The last 2 cycles of adjuvant therapy. She will return for an office visit and chemotherapy in 2 weeks.  Brittney Coder, MD  04/28/2016  9:34 AM

## 2016-04-28 NOTE — Progress Notes (Signed)
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

## 2016-04-28 NOTE — Patient Instructions (Addendum)
[place] Discharge Instructions for Patients Receiving Chemotherapy  Today you received the following chemotherapy agents;  Leucovorin, and Fluorouracil.   To help prevent nausea and vomiting after your treatment, we encourage you to take your nausea medication as directed.    If you develop nausea and vomiting that is not controlled by your nausea medication, call the clinic.   BELOW ARE SYMPTOMS THAT SHOULD BE REPORTED IMMEDIATELY:  *FEVER GREATER THAN 100.5 F  *CHILLS WITH OR WITHOUT FEVER  NAUSEA AND VOMITING THAT IS NOT CONTROLLED WITH YOUR NAUSEA MEDICATION  *UNUSUAL SHORTNESS OF BREATH  *UNUSUAL BRUISING OR BLEEDING  TENDERNESS IN MOUTH AND THROAT WITH OR WITHOUT PRESENCE OF ULCERS  *URINARY PROBLEMS  *BOWEL PROBLEMS  UNUSUAL RASH Items with * indicate a potential emergency and should be followed up as soon as possible.  Feel free to call the clinic you have any questions or concerns. The clinic phone number is [phone].  Please show the [place] at check-in to the Emergency Department and triage nurse.

## 2016-04-28 NOTE — Patient Instructions (Signed)

## 2016-04-30 ENCOUNTER — Ambulatory Visit (HOSPITAL_BASED_OUTPATIENT_CLINIC_OR_DEPARTMENT_OTHER): Payer: BLUE CROSS/BLUE SHIELD

## 2016-04-30 DIAGNOSIS — C187 Malignant neoplasm of sigmoid colon: Secondary | ICD-10-CM

## 2016-04-30 MED ORDER — HEPARIN SOD (PORK) LOCK FLUSH 100 UNIT/ML IV SOLN
500.0000 [IU] | Freq: Once | INTRAVENOUS | Status: AC | PRN
  Administered 2016-04-30: 500 [IU]
  Filled 2016-04-30: qty 5

## 2016-04-30 MED ORDER — SODIUM CHLORIDE 0.9 % IJ SOLN
10.0000 mL | INTRAMUSCULAR | Status: DC | PRN
  Administered 2016-04-30: 10 mL
  Filled 2016-04-30: qty 10

## 2016-04-30 NOTE — Patient Instructions (Signed)

## 2016-05-09 ENCOUNTER — Other Ambulatory Visit: Payer: Self-pay | Admitting: Oncology

## 2016-05-12 ENCOUNTER — Other Ambulatory Visit (HOSPITAL_BASED_OUTPATIENT_CLINIC_OR_DEPARTMENT_OTHER): Payer: BLUE CROSS/BLUE SHIELD

## 2016-05-12 ENCOUNTER — Ambulatory Visit (HOSPITAL_BASED_OUTPATIENT_CLINIC_OR_DEPARTMENT_OTHER): Payer: BLUE CROSS/BLUE SHIELD | Admitting: Nurse Practitioner

## 2016-05-12 ENCOUNTER — Ambulatory Visit (HOSPITAL_BASED_OUTPATIENT_CLINIC_OR_DEPARTMENT_OTHER): Payer: BLUE CROSS/BLUE SHIELD

## 2016-05-12 ENCOUNTER — Encounter: Payer: Self-pay | Admitting: *Deleted

## 2016-05-12 ENCOUNTER — Telehealth: Payer: Self-pay | Admitting: Oncology

## 2016-05-12 VITALS — BP 176/91 | HR 87 | Temp 98.2°F | Resp 20 | Ht 61.0 in | Wt 151.7 lb

## 2016-05-12 DIAGNOSIS — R918 Other nonspecific abnormal finding of lung field: Secondary | ICD-10-CM | POA: Diagnosis not present

## 2016-05-12 DIAGNOSIS — C187 Malignant neoplasm of sigmoid colon: Secondary | ICD-10-CM | POA: Diagnosis not present

## 2016-05-12 DIAGNOSIS — G620 Drug-induced polyneuropathy: Secondary | ICD-10-CM | POA: Diagnosis not present

## 2016-05-12 DIAGNOSIS — C779 Secondary and unspecified malignant neoplasm of lymph node, unspecified: Secondary | ICD-10-CM | POA: Diagnosis not present

## 2016-05-12 DIAGNOSIS — Z5111 Encounter for antineoplastic chemotherapy: Secondary | ICD-10-CM | POA: Diagnosis not present

## 2016-05-12 DIAGNOSIS — I10 Essential (primary) hypertension: Secondary | ICD-10-CM

## 2016-05-12 LAB — CBC WITH DIFFERENTIAL/PLATELET
BASO%: 1.2 % (ref 0.0–2.0)
Basophils Absolute: 0.1 10*3/uL (ref 0.0–0.1)
EOS ABS: 0.1 10*3/uL (ref 0.0–0.5)
EOS%: 2.2 % (ref 0.0–7.0)
HCT: 42.6 % (ref 34.8–46.6)
HGB: 14.6 g/dL (ref 11.6–15.9)
LYMPH%: 21.3 % (ref 14.0–49.7)
MCH: 32.5 pg (ref 25.1–34.0)
MCHC: 34.3 g/dL (ref 31.5–36.0)
MCV: 94.8 fL (ref 79.5–101.0)
MONO#: 0.5 10*3/uL (ref 0.1–0.9)
MONO%: 8.3 % (ref 0.0–14.0)
NEUT%: 67 % (ref 38.4–76.8)
NEUTROS ABS: 3.9 10*3/uL (ref 1.5–6.5)
PLATELETS: 181 10*3/uL (ref 145–400)
RBC: 4.5 10*6/uL (ref 3.70–5.45)
RDW: 17.3 % — ABNORMAL HIGH (ref 11.2–14.5)
WBC: 5.8 10*3/uL (ref 3.9–10.3)
lymph#: 1.2 10*3/uL (ref 0.9–3.3)

## 2016-05-12 LAB — COMPREHENSIVE METABOLIC PANEL
ALT: 27 U/L (ref 0–55)
ANION GAP: 10 meq/L (ref 3–11)
AST: 22 U/L (ref 5–34)
Albumin: 4.1 g/dL (ref 3.5–5.0)
Alkaline Phosphatase: 114 U/L (ref 40–150)
BILIRUBIN TOTAL: 0.4 mg/dL (ref 0.20–1.20)
BUN: 15.3 mg/dL (ref 7.0–26.0)
CHLORIDE: 111 meq/L — AB (ref 98–109)
CO2: 20 meq/L — AB (ref 22–29)
Calcium: 10.1 mg/dL (ref 8.4–10.4)
Creatinine: 0.9 mg/dL (ref 0.6–1.1)
EGFR: 79 mL/min/{1.73_m2} — AB (ref >90)
GLUCOSE: 126 mg/dL (ref 70–140)
POTASSIUM: 4 meq/L (ref 3.5–5.1)
SODIUM: 141 meq/L (ref 136–145)
Total Protein: 7.6 g/dL (ref 6.4–8.3)

## 2016-05-12 MED ORDER — FLUOROURACIL CHEMO INJECTION 2.5 GM/50ML
400.0000 mg/m2 | Freq: Once | INTRAVENOUS | Status: AC
  Administered 2016-05-12: 650 mg via INTRAVENOUS
  Filled 2016-05-12: qty 13

## 2016-05-12 MED ORDER — SODIUM CHLORIDE 0.9 % IV SOLN
2400.0000 mg/m2 | INTRAVENOUS | Status: DC
  Administered 2016-05-12: 4000 mg via INTRAVENOUS
  Filled 2016-05-12: qty 80

## 2016-05-12 MED ORDER — LEUCOVORIN CALCIUM INJECTION 350 MG
400.0000 mg/m2 | Freq: Once | INTRAVENOUS | Status: AC
  Administered 2016-05-12: 664 mg via INTRAVENOUS
  Filled 2016-05-12: qty 33.2

## 2016-05-12 MED ORDER — SODIUM CHLORIDE 0.9 % IV SOLN
Freq: Once | INTRAVENOUS | Status: AC
  Administered 2016-05-12: 11:00:00 via INTRAVENOUS
  Filled 2016-05-12: qty 4

## 2016-05-12 NOTE — Telephone Encounter (Signed)
Gave and printed appt sched and avs for pt for July and Aug °

## 2016-05-12 NOTE — Progress Notes (Signed)
  [place] OFFICE PROGRESS NOTE   Diagnosis:  Colon cancer  INTERVAL HISTORY:   Brittney [name] returns as scheduled. She completed cycle 11 FOLFOX 04/28/2016. Oxaliplatin was deleted due to progressive neuropathy symptoms. She denies nausea/vomiting. No mouth sores. No diarrhea. She has intermittent numbness involving 2 fingertips on the right hand. No other numbness. The numbness does not interfere with activity. She reports a normal blood pressure at home.  Objective:  Vital signs in last 24 hours:  Blood pressure 176/91, pulse 87, temperature 98.2 F (36.8 C), temperature source Oral, resp. rate 20, height '5\' 1"'$  (1.549 m), weight 151 lb 11.2 oz (68.811 kg), last menstrual period 07/28/2012, SpO2 100 %.    HEENT: No thrush or ulcers. Resp: Lungs clear bilaterally. Cardio: Regular rate and rhythm. GI: Abdomen soft and nontender. No hepatomegaly. Vascular: No leg edema. Neuro: Vibratory sense mildly decreased over the fingertips per tuning fork exam.  Skin: Hands with mild hyperpigmentation. No erythema. Port-A-Cath without erythema.    Lab Results:  Lab Results  Component Value Date   WBC 5.8 05/12/2016   HGB 14.6 05/12/2016   HCT 42.6 05/12/2016   MCV 94.8 05/12/2016   PLT 181 05/12/2016   NEUTROABS 3.9 05/12/2016    Imaging:  No results found.  Medications: I have reviewed the patient's current medications.  Assessment/Plan: 1. 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, status post a robotic sigmoid colectomy 10/29/2015  1 of 16 lymph nodes positive for metastatic adenocarcinoma, lymphovascular invasion present  Microsatellite stable, no loss of mismatch repair protein expression  Cycle 1 adjuvant FOLFOX 12/03/2015  Cycle 2 adjuvant FOLFOX 12/17/2015  Cycle 3 adjuvant FOLFOX adjuvant FOLFOX 01/14/2016  Cycle 5 adjuvant FOLFOX 01/28/2016  Cycle 6 adjuvant FOLFOX 02/11/2016  Cycle 7 adjuvant FOLFOX  02/25/2016  Cycle 8 adjuvant FOLFOX 03/17/2016  Cycle 9 adjuvant FOLFOX 03/31/2016  Cycle 10 adjuvant FOLFOX 04/14/2016  Cycle 11 adjuvant FOLFOX 04/28/2016 (oxaliplatin deleted)  Cycle 12 adjuvant FOLFOX 05/12/2016 (oxaliplatin deleted) 2. Hypertension 3. G3 P3 4. Indeterminate bilateral pulmonary nodules on the staging chest CT 09/11/2015 5. Port-A-Cath placement by Dr. Barry Dienes 11/26/2015 6. 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 She has completed 11 cycles of adjuvant FOLFOX. Plan to proceed with the 12th and final cycle today as scheduled. We will again hold oxaliplatin. She will return in 6 weeks for a Port-A-Cath flush. She will return in 3 months for labs and surveillance CT scans with a follow-up visit approximately 1 week later to review the results. Referral made to Dr. Ardis Hughs for the one-year colonoscopy.  Plan reviewed with Dr. Benay Spice.  Ned Card ANP/GNP-BC   05/12/2016  10:03 AM

## 2016-05-12 NOTE — Telephone Encounter (Signed)
Gave and printed appt sched and avs for pt for July adn Aug...gv barium

## 2016-05-13 LAB — CEA: CEA: 1.8 ng/mL (ref 0.0–4.7)

## 2016-05-14 ENCOUNTER — Ambulatory Visit (HOSPITAL_BASED_OUTPATIENT_CLINIC_OR_DEPARTMENT_OTHER): Payer: BLUE CROSS/BLUE SHIELD

## 2016-05-14 VITALS — BP 133/85 | HR 84 | Temp 99.0°F | Resp 17

## 2016-05-14 DIAGNOSIS — C187 Malignant neoplasm of sigmoid colon: Secondary | ICD-10-CM

## 2016-05-14 MED ORDER — HEPARIN SOD (PORK) LOCK FLUSH 100 UNIT/ML IV SOLN
500.0000 [IU] | Freq: Once | INTRAVENOUS | Status: AC | PRN
  Administered 2016-05-14: 500 [IU]
  Filled 2016-05-14: qty 5

## 2016-05-14 MED ORDER — SODIUM CHLORIDE 0.9 % IJ SOLN
10.0000 mL | INTRAMUSCULAR | Status: DC | PRN
  Administered 2016-05-14: 10 mL
  Filled 2016-05-14: qty 10

## 2016-05-14 NOTE — Patient Instructions (Signed)
[facility] Discharge Instructions for Patients Receiving Chemotherapy   To help prevent nausea and vomiting after your treatment, we encourage you to take your nausea medication as prescribed. If you develop nausea and vomiting that is not controlled by your nausea medication, call the clinic.   BELOW ARE SYMPTOMS THAT SHOULD BE REPORTED IMMEDIATELY:  *FEVER GREATER THAN 100.5 F  *CHILLS WITH OR WITHOUT FEVER  NAUSEA AND VOMITING THAT IS NOT CONTROLLED WITH YOUR NAUSEA MEDICATION  *UNUSUAL SHORTNESS OF BREATH  *UNUSUAL BRUISING OR BLEEDING  TENDERNESS IN MOUTH AND THROAT WITH OR WITHOUT PRESENCE OF ULCERS  *URINARY PROBLEMS  *BOWEL PROBLEMS  UNUSUAL RASH Items with * indicate a potential emergency and should be followed up as soon as possible.  Feel free to call the clinic you have any questions or concerns. The clinic phone number is [phone].  Please show the CHEMO ALERT CARD at check-in to the Emergency Department and triage nurse.   

## 2016-06-22 ENCOUNTER — Other Ambulatory Visit: Payer: Self-pay | Admitting: Nurse Practitioner

## 2016-06-23 ENCOUNTER — Other Ambulatory Visit: Payer: BLUE CROSS/BLUE SHIELD

## 2016-06-23 ENCOUNTER — Ambulatory Visit (HOSPITAL_BASED_OUTPATIENT_CLINIC_OR_DEPARTMENT_OTHER): Payer: BLUE CROSS/BLUE SHIELD

## 2016-06-23 VITALS — BP 156/86 | HR 94 | Temp 98.2°F | Resp 18

## 2016-06-23 DIAGNOSIS — Z95828 Presence of other vascular implants and grafts: Secondary | ICD-10-CM

## 2016-06-23 DIAGNOSIS — C187 Malignant neoplasm of sigmoid colon: Secondary | ICD-10-CM | POA: Diagnosis not present

## 2016-06-23 DIAGNOSIS — C779 Secondary and unspecified malignant neoplasm of lymph node, unspecified: Secondary | ICD-10-CM

## 2016-06-23 MED ORDER — HEPARIN SOD (PORK) LOCK FLUSH 100 UNIT/ML IV SOLN
500.0000 [IU] | Freq: Once | INTRAVENOUS | Status: AC | PRN
  Administered 2016-06-23: 500 [IU] via INTRAVENOUS
  Filled 2016-06-23: qty 5

## 2016-06-23 MED ORDER — SODIUM CHLORIDE 0.9 % IJ SOLN
10.0000 mL | INTRAMUSCULAR | Status: DC | PRN
  Administered 2016-06-23: 10 mL via INTRAVENOUS
  Filled 2016-06-23: qty 10

## 2016-06-23 NOTE — Patient Instructions (Signed)

## 2016-06-24 ENCOUNTER — Encounter: Payer: Self-pay | Admitting: Gastroenterology

## 2016-07-02 ENCOUNTER — Telehealth: Payer: Self-pay | Admitting: *Deleted

## 2016-07-02 NOTE — Telephone Encounter (Signed)
Oncology Nurse Navigator Documentation  Oncology Nurse Navigator Flowsheets 07/02/2016  Navigator Location [facility]  Navigator Encounter Type Telephone  Telephone Incoming Call;Outgoing Call;Appt Confirmation/Clarification  Abnormal Finding Date -  Confirmed Diagnosis Date -  Surgery Date -  Treatment Initiated Date -  Patient Visit Type -  Treatment Phase -  Barriers/Navigation Needs Coordination of Care  Education -  Interventions Coordination of Care--message sent to managed care to obtain PA for CT scan that is due. Patient requests last week of July or 1st week of August. Current PA looks to start on 8/28 and she needs scan sooner.  Coordination of Care Radiology  Education Method -  Support Groups/Services -  Acuity Level 2  Time Spent with Patient 15  Interpreter called to f/u on status of the CT scan ordered in May? Patient needs it done last week of July or 1st week in August due to her being off work and having transportation.

## 2016-07-05 ENCOUNTER — Telehealth: Payer: Self-pay | Admitting: *Deleted

## 2016-07-05 NOTE — Telephone Encounter (Signed)
Oncology Nurse Navigator Documentation  Oncology Nurse Navigator Flowsheets 07/05/2016  Navigator Location [facility]  Navigator Encounter Type Telephone  Telephone Outgoing Call;Appt Confirmation/Clarification  Abnormal Finding Date -  Confirmed Diagnosis Date -  Surgery Date -  Treatment Initiated Date -  Patient Visit Type -  Treatment Phase -  Barriers/Navigation Needs Coordination of Care--CT scan  Education -  Interventions Coordination of Care--confirmed PA and scheduled CT scan   Coordination of Care Radiology-CT 07/14/16 at 1015/1030-drink contrast at 0830 and 0930 (NPO 6 hours prior)  Education Method -Notified Almyra Free ([place] interpreter): she will inform patient. She already has her oral contrast at home.  Support Groups/Services -  Acuity -  Time Spent with Patient 15

## 2016-07-14 ENCOUNTER — Ambulatory Visit (HOSPITAL_COMMUNITY)
Admission: RE | Admit: 2016-07-14 | Discharge: 2016-07-14 | Disposition: A | Discharge status: Home / Self Care | Payer: BLUE CROSS/BLUE SHIELD | Source: Ambulatory Visit | Attending: Nurse Practitioner | Admitting: Nurse Practitioner

## 2016-07-14 ENCOUNTER — Encounter (HOSPITAL_COMMUNITY): Payer: Self-pay

## 2016-07-14 DIAGNOSIS — C187 Malignant neoplasm of sigmoid colon: Secondary | ICD-10-CM | POA: Diagnosis present

## 2016-07-14 DIAGNOSIS — K760 Fatty (change of) liver, not elsewhere classified: Secondary | ICD-10-CM | POA: Insufficient documentation

## 2016-07-14 DIAGNOSIS — R918 Other nonspecific abnormal finding of lung field: Secondary | ICD-10-CM | POA: Insufficient documentation

## 2016-07-14 DIAGNOSIS — I700 Atherosclerosis of aorta: Secondary | ICD-10-CM | POA: Diagnosis not present

## 2016-07-14 MED ORDER — IOPAMIDOL (ISOVUE-300) INJECTION 61%
100.0000 mL | Freq: Once | INTRAVENOUS | Status: AC | PRN
  Administered 2016-07-14: 100 mL via INTRAVENOUS

## 2016-07-16 ENCOUNTER — Telehealth: Payer: Self-pay | Admitting: *Deleted

## 2016-07-16 NOTE — Telephone Encounter (Signed)
Per Dr. Benay Spice, message left for pt to inform her that CT negative for recurrent cancer.  Pt instructed to call office back with any questions or concerns.

## 2016-07-16 NOTE — Telephone Encounter (Signed)
-----   Message from Ladell Pier, MD sent at 07/16/2016  4:36 PM EDT ----- Please call patient, CT negative for recurrent cancer

## 2016-07-22 ENCOUNTER — Ambulatory Visit (AMBULATORY_SURGERY_CENTER): Payer: Self-pay

## 2016-07-22 ENCOUNTER — Telehealth: Payer: Self-pay

## 2016-07-22 ENCOUNTER — Encounter (INDEPENDENT_AMBULATORY_CARE_PROVIDER_SITE_OTHER): Payer: Self-pay

## 2016-07-22 VITALS — Ht 63.5 in | Wt 160.4 lb

## 2016-07-22 DIAGNOSIS — C187 Malignant neoplasm of sigmoid colon: Secondary | ICD-10-CM

## 2016-07-22 MED ORDER — SUPREP BOWEL PREP KIT 17.5-3.13-1.6 GM/177ML PO SOLN: 1.0000 | Freq: Once | ORAL | 0 refills | Status: AC

## 2016-07-22 NOTE — Progress Notes (Signed)
No allergies to eggs or soy No past problems with anesthesia No home oxygen No diet meds  Declined emmi 

## 2016-07-22 NOTE — Telephone Encounter (Signed)
Chemo 05/14/16 for sigmoid CA; still has portacath.  Just FYI                                                      Thank you,                                                              Angela//PV

## 2016-07-22 NOTE — Telephone Encounter (Signed)
Reviewing records; her surgery was in early November 2016 and so colonoscopy should be pushed until November 2017 this year.  Can you please reschedule it, thanks?  It is OK if she still has a port in place at time of colonoscopy.  We would not use it for access but it will not get in the way.

## 2016-08-05 ENCOUNTER — Ambulatory Visit (HOSPITAL_BASED_OUTPATIENT_CLINIC_OR_DEPARTMENT_OTHER): Payer: BLUE CROSS/BLUE SHIELD

## 2016-08-05 ENCOUNTER — Ambulatory Visit (HOSPITAL_BASED_OUTPATIENT_CLINIC_OR_DEPARTMENT_OTHER): Payer: BLUE CROSS/BLUE SHIELD | Admitting: Oncology

## 2016-08-05 ENCOUNTER — Other Ambulatory Visit (HOSPITAL_BASED_OUTPATIENT_CLINIC_OR_DEPARTMENT_OTHER): Payer: BLUE CROSS/BLUE SHIELD

## 2016-08-05 VITALS — BP 166/86 | HR 103 | Temp 98.6°F | Resp 17 | Ht 63.5 in | Wt 165.1 lb

## 2016-08-05 DIAGNOSIS — C187 Malignant neoplasm of sigmoid colon: Secondary | ICD-10-CM

## 2016-08-05 DIAGNOSIS — Z85038 Personal history of other malignant neoplasm of large intestine: Secondary | ICD-10-CM | POA: Diagnosis not present

## 2016-08-05 DIAGNOSIS — Z95828 Presence of other vascular implants and grafts: Secondary | ICD-10-CM

## 2016-08-05 DIAGNOSIS — R918 Other nonspecific abnormal finding of lung field: Secondary | ICD-10-CM

## 2016-08-05 LAB — COMPREHENSIVE METABOLIC PANEL
ALT: 36 U/L (ref 0–55)
ANION GAP: 10 meq/L (ref 3–11)
AST: 21 U/L (ref 5–34)
Albumin: 4.2 g/dL (ref 3.5–5.0)
Alkaline Phosphatase: 104 U/L (ref 40–150)
BILIRUBIN TOTAL: 0.41 mg/dL (ref 0.20–1.20)
BUN: 16.4 mg/dL (ref 7.0–26.0)
CALCIUM: 9.9 mg/dL (ref 8.4–10.4)
CHLORIDE: 108 meq/L (ref 98–109)
CO2: 24 meq/L (ref 22–29)
CREATININE: 0.8 mg/dL (ref 0.6–1.1)
Glucose: 92 mg/dl (ref 70–140)
Potassium: 3.9 mEq/L (ref 3.5–5.1)
Sodium: 142 mEq/L (ref 136–145)
Total Protein: 7.5 g/dL (ref 6.4–8.3)

## 2016-08-05 LAB — CEA (IN HOUSE-CHCC): CEA (CHCC-In House): 1.33 ng/mL (ref 0.00–5.00)

## 2016-08-05 MED ORDER — SODIUM CHLORIDE 0.9 % IJ SOLN
10.0000 mL | INTRAMUSCULAR | Status: DC | PRN
  Administered 2016-08-05: 10 mL via INTRAVENOUS
  Filled 2016-08-05: qty 10

## 2016-08-05 MED ORDER — HEPARIN SOD (PORK) LOCK FLUSH 100 UNIT/ML IV SOLN
500.0000 [IU] | Freq: Once | INTRAVENOUS | Status: AC | PRN
  Administered 2016-08-05: 500 [IU] via INTRAVENOUS
  Filled 2016-08-05: qty 5

## 2016-08-05 NOTE — Patient Instructions (Signed)

## 2016-08-05 NOTE — Progress Notes (Signed)
  [facility] OFFICE PROGRESS NOTE   Diagnosis: Colon cancer  INTERVAL HISTORY:   She returns as scheduled. Restaging CT 07/14/2016 revealed no evidence of recurrent disease. She feels well. She is working. Mild numbness and a few fingers that has improved. This does not interfere with activity. She is scheduled for a colonoscopy in November.  Objective:  Vital signs in last 24 hours:  Last menstrual period 07/28/2014.    HEENT: Neck without mass Lymphatics: No cervical, supraclavicular, axillary, or inguinal nodes Resp: Lungs clear bilaterally Cardio: Regular rate and rhythm GI: No hepatosplenomegaly, no mass, nontender Vascular: No leg edema   Portacath/PICC-without erythema  Lab Results:  Lab Results  Component Value Date   WBC 5.8 05/12/2016   HGB 14.6 05/12/2016   HCT 42.6 05/12/2016   MCV 94.8 05/12/2016   PLT 181 05/12/2016   NEUTROABS 3.9 05/12/2016      Lab Results  Component Value Date   CEA1 1.8 05/12/2016    I Medications: I have reviewed the patient's current medications.  Assessment/Plan: 1. 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, status post a robotic sigmoid colectomy 10/29/2015  1 of 16 lymph nodes positive for metastatic adenocarcinoma, lymphovascular invasion present  Microsatellite stable, no loss of mismatch repair protein expression  Cycle 1 adjuvant FOLFOX 12/03/2015  Cycle 2 adjuvant FOLFOX 12/17/2015  Cycle 3 adjuvant FOLFOX adjuvant FOLFOX 01/14/2016  Cycle 5 adjuvant FOLFOX 01/28/2016  Cycle 6 adjuvant FOLFOX 02/11/2016  Cycle 7 adjuvant FOLFOX 02/25/2016  Cycle 8 adjuvant FOLFOX 03/17/2016  Cycle 9 adjuvant FOLFOX 03/31/2016  Cycle 10 adjuvant FOLFOX 04/14/2016  Cycle 11 adjuvant FOLFOX 04/28/2016 (oxaliplatin deleted)  Cycle 12 adjuvant FOLFOX 05/12/2016 (oxaliplatin deleted)  CT 07/14/2016-no evidence of recurrent disease 2. Hypertension 3. G3  P3 4. Indeterminate bilateral pulmonary nodules on the staging chest CT 09/11/2015, stable on a CT 07/14/2016 5. Port-A-Cath placement by Dr. Barry Dienes 11/26/2015 6. Oxaliplatin neuropathy-improved   Disposition:  Ms. Orzel remains in clinical remission from colon cancer. We will follow-up on the CEA from today. She will return for an office visit and CEA in 6 months. I will refer her to Dr. Barry Dienes for removal of the Port-A-Cath.  [name], MD  08/05/2016  9:25 AM

## 2016-08-06 ENCOUNTER — Telehealth: Payer: Self-pay | Admitting: *Deleted

## 2016-08-06 ENCOUNTER — Telehealth: Payer: Self-pay | Admitting: Oncology

## 2016-08-06 LAB — CEA: CEA: 1.6 ng/mL (ref 0.0–4.7)

## 2016-08-06 NOTE — Telephone Encounter (Signed)
-----   Message from Ladell Pier, MD sent at 08/06/2016 11:10 AM EDT ----- Please call patient , cea is normal

## 2016-08-06 NOTE — Telephone Encounter (Signed)
Called pt unable to reach, lmovm CEA normal. Requested call back to confirm message was received.

## 2016-08-06 NOTE — Telephone Encounter (Signed)
Scheduled apt, juile Is notifying pt

## 2016-08-18 ENCOUNTER — Other Ambulatory Visit: Payer: Self-pay | Admitting: General Surgery

## 2016-08-20 ENCOUNTER — Encounter: Payer: BLUE CROSS/BLUE SHIELD | Admitting: Gastroenterology

## 2016-08-27 ENCOUNTER — Encounter: Payer: BLUE CROSS/BLUE SHIELD | Admitting: Gastroenterology

## 2016-10-22 ENCOUNTER — Encounter: Payer: BLUE CROSS/BLUE SHIELD | Admitting: Gastroenterology

## 2016-10-26 ENCOUNTER — Encounter: Payer: Self-pay | Admitting: Gastroenterology

## 2016-10-26 ENCOUNTER — Encounter (HOSPITAL_COMMUNITY): Payer: Self-pay | Admitting: *Deleted

## 2016-10-26 NOTE — Progress Notes (Signed)
Pt SDW-Pre-op call completed by pt using [employer], Elita Quick  [phone]). Pt denies SOB, chest pain, and being under the care of a cardiologist. Pt denies having a stress test and cardiac cath.  Pt denies having a chest x ray and EKG in the last year ( last EKG was 10/27/15). Pt made aware to stop taking Aspirin, vitamins, fish oil and herbal medications. Do not take any NSAIDs ie: Ibuprofen, Advil, Naproxen, BC and Goody Powder or any medication containing Aspirin. Pt verbalized understanding of all pre-op instructions.

## 2016-10-27 ENCOUNTER — Ambulatory Visit (HOSPITAL_COMMUNITY): Payer: BLUE CROSS/BLUE SHIELD | Admitting: Certified Registered"

## 2016-10-27 ENCOUNTER — Encounter (HOSPITAL_COMMUNITY): Payer: Self-pay | Admitting: Certified Registered"

## 2016-10-27 ENCOUNTER — Encounter (HOSPITAL_COMMUNITY)
Admission: RE | Disposition: A | Discharge status: Home / Self Care | Payer: Self-pay | Source: Ambulatory Visit | Attending: General Surgery

## 2016-10-27 ENCOUNTER — Ambulatory Visit (HOSPITAL_COMMUNITY)
Admission: RE | Admit: 2016-10-27 | Discharge: 2016-10-27 | Disposition: A | Discharge status: Home / Self Care | Payer: BLUE CROSS/BLUE SHIELD | Source: Ambulatory Visit | Attending: General Surgery | Admitting: General Surgery

## 2016-10-27 DIAGNOSIS — I10 Essential (primary) hypertension: Secondary | ICD-10-CM | POA: Diagnosis not present

## 2016-10-27 DIAGNOSIS — Z4589 Encounter for adjustment and management of other implanted devices: Secondary | ICD-10-CM | POA: Insufficient documentation

## 2016-10-27 DIAGNOSIS — Z9049 Acquired absence of other specified parts of digestive tract: Secondary | ICD-10-CM | POA: Diagnosis not present

## 2016-10-27 DIAGNOSIS — Z85038 Personal history of other malignant neoplasm of large intestine: Secondary | ICD-10-CM | POA: Diagnosis not present

## 2016-10-27 DIAGNOSIS — C187 Malignant neoplasm of sigmoid colon: Secondary | ICD-10-CM | POA: Diagnosis present

## 2016-10-27 DIAGNOSIS — Z9221 Personal history of antineoplastic chemotherapy: Secondary | ICD-10-CM | POA: Diagnosis not present

## 2016-10-27 HISTORY — PX: PORT-A-CATH REMOVAL: SHX5289

## 2016-10-27 LAB — CBC
HCT: 44.6 % (ref 36.0–46.0)
Hemoglobin: 15.5 g/dL — ABNORMAL HIGH (ref 12.0–15.0)
MCH: 31 pg (ref 26.0–34.0)
MCHC: 34.8 g/dL (ref 30.0–36.0)
MCV: 89.2 fL (ref 78.0–100.0)
PLATELETS: 235 10*3/uL (ref 150–400)
RBC: 5 MIL/uL (ref 3.87–5.11)
RDW: 12.8 % (ref 11.5–15.5)
WBC: 5.8 10*3/uL (ref 4.0–10.5)

## 2016-10-27 LAB — BASIC METABOLIC PANEL
Anion gap: 11 (ref 5–15)
BUN: 9 mg/dL (ref 6–20)
CHLORIDE: 108 mmol/L (ref 101–111)
CO2: 22 mmol/L (ref 22–32)
CREATININE: 0.76 mg/dL (ref 0.44–1.00)
Calcium: 9.7 mg/dL (ref 8.9–10.3)
GFR calc Af Amer: >60 mL/min (ref >60)
GFR calc non Af Amer: >60 mL/min (ref >60)
Glucose, Bld: 108 mg/dL — ABNORMAL HIGH (ref 65–99)
Potassium: 3.3 mmol/L — ABNORMAL LOW (ref 3.5–5.1)
Sodium: 141 mmol/L (ref 135–145)

## 2016-10-27 SURGERY — REMOVAL PORT-A-CATH
Anesthesia: Monitor Anesthesia Care | Site: Chest

## 2016-10-27 MED ORDER — OXYCODONE HCL 5 MG PO TABS: 5.0000 mg | ORAL_TABLET | Freq: Once | ORAL | Status: DC | PRN

## 2016-10-27 MED ORDER — FENTANYL CITRATE (PF) 100 MCG/2ML IJ SOLN
INTRAMUSCULAR | Status: AC
  Filled 2016-10-27: qty 2

## 2016-10-27 MED ORDER — LACTATED RINGERS IV SOLN
INTRAVENOUS | Status: DC
  Administered 2016-10-27: 16:00:00 via INTRAVENOUS

## 2016-10-27 MED ORDER — ACETAMINOPHEN 325 MG PO TABS
650.0000 mg | ORAL_TABLET | ORAL | Status: DC | PRN
  Filled 2016-10-27: qty 2

## 2016-10-27 MED ORDER — SODIUM CHLORIDE 0.9% FLUSH: 3.0000 mL | INTRAVENOUS | Status: DC | PRN

## 2016-10-27 MED ORDER — MIDAZOLAM HCL 2 MG/2ML IJ SOLN
INTRAMUSCULAR | Status: AC
  Filled 2016-10-27: qty 2

## 2016-10-27 MED ORDER — FENTANYL CITRATE (PF) 100 MCG/2ML IJ SOLN: 25.0000 ug | INTRAMUSCULAR | Status: DC | PRN

## 2016-10-27 MED ORDER — LIDOCAINE HCL 1 % IJ SOLN
INTRAMUSCULAR | Status: DC | PRN
  Administered 2016-10-27: 8 mL via SUBCUTANEOUS

## 2016-10-27 MED ORDER — MIDAZOLAM HCL 5 MG/5ML IJ SOLN
INTRAMUSCULAR | Status: DC | PRN
  Administered 2016-10-27: 2 mg via INTRAVENOUS

## 2016-10-27 MED ORDER — CEFAZOLIN SODIUM-DEXTROSE 2-4 GM/100ML-% IV SOLN
INTRAVENOUS | Status: AC
  Filled 2016-10-27: qty 100

## 2016-10-27 MED ORDER — ONDANSETRON HCL 4 MG/2ML IJ SOLN: 4.0000 mg | Freq: Four times a day (QID) | INTRAMUSCULAR | Status: DC | PRN

## 2016-10-27 MED ORDER — EPHEDRINE 5 MG/ML INJ
INTRAVENOUS | Status: AC
  Filled 2016-10-27: qty 10

## 2016-10-27 MED ORDER — ACETAMINOPHEN 650 MG RE SUPP
650.0000 mg | RECTAL | Status: DC | PRN
  Filled 2016-10-27: qty 1

## 2016-10-27 MED ORDER — OXYCODONE HCL 5 MG/5ML PO SOLN: 5.0000 mg | Freq: Once | ORAL | Status: DC | PRN

## 2016-10-27 MED ORDER — OXYCODONE HCL 5 MG PO TABS: 5.0000 mg | ORAL_TABLET | ORAL | Status: DC | PRN

## 2016-10-27 MED ORDER — SODIUM CHLORIDE 0.9% FLUSH: 3.0000 mL | Freq: Two times a day (BID) | INTRAVENOUS | Status: DC

## 2016-10-27 MED ORDER — CEFAZOLIN SODIUM-DEXTROSE 2-4 GM/100ML-% IV SOLN
2.0000 g | INTRAVENOUS | Status: AC
  Administered 2016-10-27: 2 g via INTRAVENOUS

## 2016-10-27 MED ORDER — CHLORHEXIDINE GLUCONATE CLOTH 2 % EX PADS: 6.0000 | MEDICATED_PAD | Freq: Once | CUTANEOUS | Status: DC

## 2016-10-27 MED ORDER — SODIUM CHLORIDE 0.9 % IV SOLN: 250.0000 mL | INTRAVENOUS | Status: DC | PRN

## 2016-10-27 MED ORDER — FENTANYL CITRATE (PF) 100 MCG/2ML IJ SOLN
INTRAMUSCULAR | Status: DC | PRN
Start: 2016-10-27 — End: 2016-10-27
  Administered 2016-10-27: 100 ug via INTRAVENOUS

## 2016-10-27 MED ORDER — OXYCODONE HCL 5 MG PO TABS: 5.0000 mg | ORAL_TABLET | Freq: Four times a day (QID) | ORAL | 0 refills | Status: DC | PRN

## 2016-10-27 SURGICAL SUPPLY — 35 items
ADH SKN CLS APL DERMABOND .7 (GAUZE/BANDAGES/DRESSINGS) ×1
BLADE SURG 10 STRL SS (BLADE) ×1 IMPLANT
BLADE SURG 15 STRL LF DISP TIS (BLADE) ×1 IMPLANT
BLADE SURG 15 STRL SS (BLADE)
CHLORAPREP W/TINT 10.5 ML (MISCELLANEOUS) ×2 IMPLANT
COVER SURGICAL LIGHT HANDLE (MISCELLANEOUS) ×2 IMPLANT
DECANTER SPIKE VIAL GLASS SM (MISCELLANEOUS) ×4 IMPLANT
DERMABOND ADVANCED (GAUZE/BANDAGES/DRESSINGS) ×1
DERMABOND ADVANCED .7 DNX12 (GAUZE/BANDAGES/DRESSINGS) ×1 IMPLANT
DRAPE LAPAROTOMY 100X72 PEDS (DRAPES) ×2 IMPLANT
DRAPE UTILITY XL STRL (DRAPES) ×3 IMPLANT
ELECT CAUTERY BLADE 6.4 (BLADE) ×2 IMPLANT
ELECT REM PT RETURN 9FT ADLT (ELECTROSURGICAL) ×2
ELECTRODE REM PT RTRN 9FT ADLT (ELECTROSURGICAL) ×1 IMPLANT
GAUZE SPONGE 4X4 16PLY XRAY LF (GAUZE/BANDAGES/DRESSINGS) ×2 IMPLANT
GLOVE BIO SURGEON STRL SZ 6 (GLOVE) ×2 IMPLANT
GLOVE BIOGEL PI IND STRL 6.5 (GLOVE) ×1 IMPLANT
GLOVE BIOGEL PI INDICATOR 6.5 (GLOVE) ×1
GOWN STRL REUS W/ TWL LRG LVL3 (GOWN DISPOSABLE) ×1 IMPLANT
GOWN STRL REUS W/TWL 2XL LVL3 (GOWN DISPOSABLE) ×2 IMPLANT
GOWN STRL REUS W/TWL LRG LVL3 (GOWN DISPOSABLE) ×2
KIT BASIN OR (CUSTOM PROCEDURE TRAY) ×2 IMPLANT
KIT ROOM TURNOVER OR (KITS) ×2 IMPLANT
NDL HYPO 25GX1X1/2 BEV (NEEDLE) ×1 IMPLANT
NEEDLE HYPO 25GX1X1/2 BEV (NEEDLE) ×2 IMPLANT
NS IRRIG 1000ML POUR BTL (IV SOLUTION) ×2 IMPLANT
PACK SURGICAL SETUP 50X90 (CUSTOM PROCEDURE TRAY) ×2 IMPLANT
PAD ARMBOARD 7.5X6 YLW CONV (MISCELLANEOUS) ×4 IMPLANT
PENCIL BUTTON HOLSTER BLD 10FT (ELECTRODE) ×2 IMPLANT
SUT MON AB 4-0 PC3 18 (SUTURE) ×2 IMPLANT
SUT VIC AB 3-0 SH 27 (SUTURE) ×2
SUT VIC AB 3-0 SH 27X BRD (SUTURE) ×1 IMPLANT
SYR CONTROL 10ML LL (SYRINGE) ×2 IMPLANT
TOWEL OR 17X24 6PK STRL BLUE (TOWEL DISPOSABLE) ×2 IMPLANT
TOWEL OR 17X26 10 PK STRL BLUE (TOWEL DISPOSABLE) ×2 IMPLANT

## 2016-10-27 NOTE — Op Note (Signed)
  PRE-OPERATIVE DIAGNOSIS:  un-needed Port-A-Cath for colon cancer  POST-OPERATIVE DIAGNOSIS:  Same   PROCEDURE:  Procedure(s):  REMOVAL PORT-A-CATH  SURGEON:  Surgeon(s):  Stark Klein, MD  ANESTHESIA:   MAC + local  EBL:   Minimal  SPECIMEN:  None  Complications : none known  Procedure:   Pt was  identified in the holding area and taken to the operating room where she was placed supine on the operating room table.  MAC anesthesia was induced.  The left upper chest was prepped and draped.  The prior incision was anesthetized with local anesthetic.  The incision was opened with a #15 blade.  The subcutaneous tissue was divided with the cautery.  The port was identified and the capsule opened.  The four 2-0 prolene sutures were removed.  The port was then removed and pressure held on the tract.  The catheter appeared intact without evidence of breakage, length was 21 cm.  The wound was inspected for hemostasis, which was achieved with cautery.  The wound was closed with 3-0 vicryl deep dermal interrupted sutures and 4-0 Monocryl running subcuticular suture.  The wound was cleaned, dried, and dressed with dermabond.  The patient was awakened from anesthesia and taken to the PACU in stable condition.  Needle, sponge, and instrument counts are correct.

## 2016-10-27 NOTE — H&P (Signed)
Brittney [name] is an 50 y.o. female.   Chief Complaint: history of colon cancer HPI:  Pt is a 50 yo F who is s/p sigmoid colectomy and adjuvant chemotherapy for a T3N1 sigmoid colon cancer.  She completed chemo in May of this year.  She had restaging studies in July with no evidence of recurrent disease.    Past Medical History:  Diagnosis Date  . Cancer [facility])    colon cancer  . History of gallstones   . Hypertension     Past Surgical History:  Procedure Laterality Date  . BREAST BIOPSY    . BREAST MASS EXCISION Left 2007   benign   . CHOLECYSTECTOMY     pt states just had the gallstones removed   . COLON SURGERY  10-29-15   robotic sigmoid colectomy  . PORTACATH PLACEMENT Left 11/26/2015   Procedure: INSERTION PORT-A-CATH;  Surgeon: Stark Klein, MD;  Location: [facility];  Service: General;  Laterality: Left;    Family History  Problem Relation Age of Onset  . Colon cancer Neg Hx    Social History:  reports that she has never smoked. She has never used smokeless tobacco. She reports that she does not drink alcohol or use drugs.  Allergies: No Known Allergies  Medications Prior to Admission  Medication Sig Dispense Refill  . lidocaine-prilocaine (EMLA) cream Apply small amount over port area 1-2 hours prior to treatment and cover with plastic wrap.  DO NOT RUB IN (Patient not taking: Reported on 10/27/2016) 30 g 0    Results for orders placed or performed during the hospital encounter of 10/27/16 (from the past 48 hour(s))  Basic metabolic panel     Status: Abnormal   Collection Time: 10/27/16  1:55 PM  Result Value Ref Range   Sodium 141 135 - 145 mmol/L   Potassium 3.3 (L) 3.5 - 5.1 mmol/L   Chloride 108 101 - 111 mmol/L   CO2 22 22 - 32 mmol/L   Glucose, Bld 108 (H) 65 - 99 mg/dL   BUN 9 6 - 20 mg/dL   Creatinine, Ser 0.76 0.44 - 1.00 mg/dL   Calcium 9.7 8.9 - 10.3 mg/dL   GFR calc non Af Amer >60 >60 mL/min   GFR calc Af Amer >60 >60 mL/min   Comment: (NOTE) The eGFR has been calculated using the CKD EPI equation. This calculation has not been validated in all clinical situations. eGFR's persistently <60 mL/min signify possible Chronic Kidney Disease.    Anion gap 11 5 - 15  CBC     Status: Abnormal   Collection Time: 10/27/16  1:55 PM  Result Value Ref Range   WBC 5.8 4.0 - 10.5 K/uL   RBC 5.00 3.87 - 5.11 MIL/uL   Hemoglobin 15.5 (H) 12.0 - 15.0 g/dL   HCT 44.6 36.0 - 46.0 %   MCV 89.2 78.0 - 100.0 fL   MCH 31.0 26.0 - 34.0 pg   MCHC 34.8 30.0 - 36.0 g/dL   RDW 12.8 11.5 - 15.5 %   Platelets 235 150 - 400 K/uL   No results found.  Review of Systems  All other systems reviewed and are negative.   Blood pressure (!) 216/98, pulse 93, temperature 98.8 F (37.1 C), temperature source Oral, resp. rate 20, height '5\' 6"'  (1.676 m), weight 72.6 kg (160 lb), last menstrual period 07/28/2014, SpO2 100 %. Physical Exam  Constitutional: She is oriented to person, place, and time. She appears well-developed and  well-nourished. No distress.  HENT:  Head: Normocephalic and atraumatic.  Eyes: Conjunctivae are normal. Pupils are equal, round, and reactive to light. No scleral icterus.  Neck: Neck supple. No thyromegaly present.  Cardiovascular: Normal rate and regular rhythm.   Respiratory: Effort normal. No respiratory distress.  GI: Soft.  Neurological: She is alert and oriented to person, place, and time. Coordination normal.  Skin: Skin is warm and dry. No rash noted. She is not diaphoretic. No erythema. No pallor.  Psychiatric: She has a normal mood and affect. Her behavior is normal. Judgment and thought content normal.     Assessment/Plan History of colon cancer. Plan removal of port a cath. Discussed risks and benefits.    Stark Klein, MD 10/27/2016, 3:21 PM

## 2016-10-27 NOTE — Progress Notes (Signed)
Pt is not clear about taking meds. Apparently she stopped taking.but hasnt seen md.

## 2016-10-27 NOTE — Transfer of Care (Signed)
Immediate Anesthesia Transfer of Care Note  Patient: [name]  Procedure(s) Performed: Procedure(s): REMOVAL PORT-A-CATH (N/A)  Patient Location: PACU  Anesthesia Type:MAC  Level of Consciousness: awake, oriented and patient cooperative  Airway & Oxygen Therapy: Patient Spontanous Breathing  Post-op Assessment: Report given to RN, Post -op Vital signs reviewed and stable and Patient moving all extremities  Post vital signs: Reviewed and stable  Last Vitals:  Vitals:   10/27/16 1341 10/27/16 1353  BP: (!) 221/102 (!) 216/98  Pulse: 93   Resp: 20   Temp: 37.1 C     Last Pain:  Vitals:   10/27/16 1341  TempSrc: Oral         Complications: No apparent anesthesia complications

## 2016-10-27 NOTE — Discharge Instructions (Signed)
[facility] °Office Phone Number [phone] ° ° POST OP INSTRUCTIONS ° °Always review your discharge instruction sheet given to you by the facility where your surgery was performed. ° °IF YOU HAVE DISABILITY OR FAMILY LEAVE FORMS, YOU MUST BRING THEM TO THE OFFICE FOR PROCESSING.  DO NOT GIVE THEM TO YOUR DOCTOR. ° °1. A prescription for pain medication may be given to you upon discharge.  Take your pain medication as prescribed, if needed.  If narcotic pain medicine is not needed, then you may take acetaminophen (Tylenol) or ibuprofen (Advil) as needed. °2. Take your usually prescribed medications unless otherwise directed °3. If you need a refill on your pain medication, please contact your pharmacy.  They will contact our office to request authorization.  Prescriptions will not be filled after 5pm or on week-ends. °4. You should eat very light the first 24 hours after surgery, such as soup, crackers, pudding, etc.  Resume your normal diet the day after surgery °5. It is common to experience some constipation if taking pain medication after surgery.  Increasing fluid intake and taking a stool softener will usually help or prevent this problem from occurring.  A mild laxative (Milk of Magnesia or Miralax) should be taken according to package directions if there are no bowel movements after 48 hours. °6. You may shower in 48 hours.  The surgical glue will flake off in 2-3 weeks.   °7. ACTIVITIES:  No strenuous activity or heavy lifting for 1 week.   °a. You may drive when you no longer are taking prescription pain medication, you can comfortably wear a seatbelt, and you can safely maneuver your car and apply brakes. °b. RETURN TO WORK:  __________1 week_______________ °You should see your doctor in the office for a follow-up appointment approximately three-four weeks after your surgery.   ° °WHEN TO CALL YOUR DOCTOR: °1. Fever over 101.0 °2. Nausea and/or vomiting. °3. Extreme swelling or  bruising. °4. Continued bleeding from incision. °5. Increased pain, redness, or drainage from the incision. ° °The clinic staff is available to answer your questions during regular business hours.  Please don’t hesitate to call and ask to speak to one of the nurses for clinical concerns.  If you have a medical emergency, go to the nearest emergency room or call 911.  A surgeon from [facility] is always on call at the hospital. ° °For further questions, please visit [email or web address]  ° °

## 2016-10-27 NOTE — Anesthesia Preprocedure Evaluation (Signed)
Anesthesia Evaluation  Patient identified by MRN, date of birth, ID band Patient awake    Reviewed: Allergy & Precautions, NPO status , Patient's Chart, lab work & pertinent test results  Airway Mallampati: II   Neck ROM: full    Dental   Pulmonary neg pulmonary ROS,    breath sounds clear to auscultation       Cardiovascular hypertension,  Rhythm:regular Rate:Normal     Neuro/Psych    GI/Hepatic H/o colon CA.   Endo/Other    Renal/GU      Musculoskeletal   Abdominal   Peds  Hematology   Anesthesia Other Findings   Reproductive/Obstetrics                             Anesthesia Physical Anesthesia Plan  ASA: II  Anesthesia Plan: MAC   Post-op Pain Management:    Induction: Intravenous  Airway Management Planned: Simple Face Mask  Additional Equipment:   Intra-op Plan:   Post-operative Plan:   Informed Consent: I have reviewed the patients History and Physical, chart, labs and discussed the procedure including the risks, benefits and alternatives for the proposed anesthesia with the patient or authorized representative who has indicated his/her understanding and acceptance.     Plan Discussed with: CRNA, Anesthesiologist and Surgeon  Anesthesia Plan Comments:         Anesthesia Quick Evaluation

## 2016-10-27 NOTE — Anesthesia Procedure Notes (Signed)
Procedure Name: MAC Date/Time: 10/27/2016 4:45 PM Performed by: Melina Copa, Kamryn Gauthier R Pre-anesthesia Checklist: Patient identified, Emergency Drugs available, Suction available, Patient being monitored and Timeout performed Patient Re-evaluated:Patient Re-evaluated prior to inductionOxygen Delivery Method: Nasal cannula Placement Confirmation: positive ETCO2 Dental Injury: Teeth and Oropharynx as per pre-operative assessment

## 2016-10-27 NOTE — Anesthesia Postprocedure Evaluation (Signed)
Anesthesia Post Note  Patient: Brittney [name]  Procedure(s) Performed: Procedure(s) (LRB): REMOVAL PORT-A-CATH (N/A)  Patient location during evaluation: PACU Anesthesia Type: MAC Level of consciousness: awake and alert Pain management: pain level controlled Vital Signs Assessment: post-procedure vital signs reviewed and stable Respiratory status: spontaneous breathing, nonlabored ventilation, respiratory function stable and patient connected to nasal cannula oxygen Cardiovascular status: stable and blood pressure returned to baseline Anesthetic complications: no    Last Vitals:  Vitals:   10/27/16 1728 10/27/16 1733  BP:  (!) 184/87  Pulse:  80  Resp:  18  Temp: 36.7 C     Last Pain:  Vitals:   10/27/16 1728  TempSrc:   PainSc: 0-No pain                 Tarrell Debes S

## 2016-10-28 ENCOUNTER — Encounter (HOSPITAL_COMMUNITY): Payer: Self-pay | Admitting: General Surgery

## 2016-11-08 ENCOUNTER — Ambulatory Visit (AMBULATORY_SURGERY_CENTER): Payer: BLUE CROSS/BLUE SHIELD | Admitting: Gastroenterology

## 2016-11-08 ENCOUNTER — Encounter: Payer: Self-pay | Admitting: Gastroenterology

## 2016-11-08 VITALS — BP 176/90 | HR 70 | Temp 99.5°F | Resp 14 | Ht 63.0 in | Wt 160.0 lb

## 2016-11-08 DIAGNOSIS — K649 Unspecified hemorrhoids: Secondary | ICD-10-CM

## 2016-11-08 DIAGNOSIS — Z85038 Personal history of other malignant neoplasm of large intestine: Secondary | ICD-10-CM

## 2016-11-08 DIAGNOSIS — K5730 Diverticulosis of large intestine without perforation or abscess without bleeding: Secondary | ICD-10-CM | POA: Diagnosis not present

## 2016-11-08 DIAGNOSIS — D123 Benign neoplasm of transverse colon: Secondary | ICD-10-CM | POA: Diagnosis not present

## 2016-11-08 MED ORDER — SODIUM CHLORIDE 0.9 % IV SOLN: 500.0000 mL | INTRAVENOUS | Status: DC

## 2016-11-08 NOTE — Op Note (Signed)
[facility] Patient Name: Brittney [name] Procedure Date: 11/08/2016 11:07 AM MRN: [id number] Endoscopist: Milus Banister , MD Age: 50 Referring MD:  Date of Birth: [birth year] Gender: Female Account #: [id number] Procedure:                Colonoscopy Indications:              High risk colon cancer surveillance: Personal                            history of colon cancer; T3N1 sigmoid colon cancer;                            diagnosed 2016 Dr. Ardis Hughs colonoscopy; S/p                            segmental resection 10/2015 Dr. Barry Dienes, adjuvant                            chemo with Dr. Benay Spice. Medicines:                Monitored Anesthesia Care Procedure:                Pre-Anesthesia Assessment:                           - Prior to the procedure, a History and Physical                            was performed, and patient medications and                            allergies were reviewed. The patient's tolerance of                            previous anesthesia was also reviewed. The risks                            and benefits of the procedure and the sedation                            options and risks were discussed with the patient.                            All questions were answered, and informed consent                            was obtained. Prior Anticoagulants: The patient has                            taken no previous anticoagulant or antiplatelet                            agents. ASA Grade Assessment: II - A patient with  mild systemic disease. After reviewing the risks                            and benefits, the patient was deemed in                            satisfactory condition to undergo the procedure.                           After obtaining informed consent, the colonoscope                            was passed under direct vision. Throughout the                            procedure, the patient's blood pressure,  pulse, and                            oxygen saturations were monitored continuously. The                            Model CF-HQ190L [id number]) scope was introduced                            through the anus and advanced to the the cecum,                            identified by appendiceal orifice and ileocecal                            valve. The colonoscopy was performed without                            difficulty. The patient tolerated the procedure                            well. The quality of the bowel preparation was                            excellent. The quality of the bowel preparation was                            excellent. The ileocecal valve, appendiceal                            orifice, and rectum were photographed. Scope In: 11:11:11 AM Scope Out: [id number] AM Scope Withdrawal Time: 0 hours 5 minutes 53 seconds  Total Procedure Duration: 0 hours 7 minutes 7 seconds  Findings:                 A 4 mm polyp was found in the transverse colon. The                            polyp was sessile. The polyp was removed  with a                            cold snare. Resection and retrieval were complete.                           Internal hemorrhoids were found. The hemorrhoids                            were medium-sized.                           There was a normal appaering end-to-end                            colo-colonic anastomosis in the sigmoid colon.                           The exam was otherwise without abnormality on                            direct and retroflexion views. Complications:            No immediate complications. Estimated blood loss:                            None. Estimated Blood Loss:     Estimated blood loss: none. Impression:               - One 4 mm polyp in the transverse colon, removed                            with a cold snare. Resected and retrieved.                           - Internal hemorrhoids.                           -  Normal end-to-end colo-colonic anastomosis.                           - The examination was otherwise normal on direct                            and retroflexion views. Recommendation:           - Patient has a contact number available for                            emergencies. The signs and symptoms of potential                            delayed complications were discussed with the                            patient. Return to normal activities tomorrow.  Written discharge instructions were provided to the                            patient.                           - Resume previous diet.                           - Continue present medications.                           You will receive a letter within 2-3 weeks with the                            pathology results and my final recommendations.                           If the polyp(s) is proven to be 'pre-cancerous' on                            pathology, you will need repeat colonoscopy in 3                            years. Milus Banister, MD 11/08/2016 11:23:29 AM This report has been signed electronically.

## 2016-11-08 NOTE — Progress Notes (Signed)
Report given to PACU RN, vss 

## 2016-11-08 NOTE — Progress Notes (Signed)
Patient stated she did not take her blood pressure medicine today. But once she leave here she will take her medication.

## 2016-11-08 NOTE — Patient Instructions (Signed)
Discharge instructions given. Handouts on polyps and hemorrhoids. Resume previous medications. YOU HAD AN ENDOSCOPIC PROCEDURE TODAY AT THE  ENDOSCOPY CENTER:   Refer to the procedure report that was given to you for any specific questions about what was found during the examination.  If the procedure report does not answer your questions, please call your gastroenterologist to clarify.  If you requested that your care partner not be given the details of your procedure findings, then the procedure report has been included in a sealed envelope for you to review at your convenience later.  YOU SHOULD EXPECT: Some feelings of bloating in the abdomen. Passage of more gas than usual.  Walking can help get rid of the air that was put into your GI tract during the procedure and reduce the bloating. If you had a lower endoscopy (such as a colonoscopy or flexible sigmoidoscopy) you may notice spotting of blood in your stool or on the toilet paper. If you underwent a bowel prep for your procedure, you may not have a normal bowel movement for a few days.  Please Note:  You might notice some irritation and congestion in your nose or some drainage.  This is from the oxygen used during your procedure.  There is no need for concern and it should clear up in a day or so.  SYMPTOMS TO REPORT IMMEDIATELY:   Following lower endoscopy (colonoscopy or flexible sigmoidoscopy):  Excessive amounts of blood in the stool  Significant tenderness or worsening of abdominal pains  Swelling of the abdomen that is new, acute  Fever of 100F or higher   For urgent or emergent issues, a gastroenterologist can be reached at any hour by calling [phone].   DIET:  We do recommend a small meal at first, but then you may proceed to your regular diet.  Drink plenty of fluids but you should avoid alcoholic beverages for 24 hours.  ACTIVITY:  You should plan to take it easy for the rest of today and you should NOT DRIVE  or use heavy machinery until tomorrow (because of the sedation medicines used during the test).    FOLLOW UP: Our staff will call the number listed on your records the next business day following your procedure to check on you and address any questions or concerns that you may have regarding the information given to you following your procedure. If we do not reach you, we will leave a message.  However, if you are feeling well and you are not experiencing any problems, there is no need to return our call.  We will assume that you have returned to your regular daily activities without incident.  If any biopsies were taken you will be contacted by phone or by letter within the next 1-3 weeks.  Please call us at [phone] if you have not heard about the biopsies in 3 weeks.    SIGNATURES/CONFIDENTIALITY: You and/or your care partner have signed paperwork which will be entered into your electronic medical record.  These signatures attest to the fact that that the information above on your After Visit Summary has been reviewed and is understood.  Full responsibility of the confidentiality of this discharge information lies with you and/or your care-partner. 

## 2016-11-09 ENCOUNTER — Telehealth: Payer: Self-pay

## 2016-11-09 NOTE — Telephone Encounter (Signed)
  Follow up Call-  Call back number 11/08/2016 09/09/2015  Post procedure Call Back phone  # [phone], son and husband speak english [phone]  Permission to leave phone message Yes Yes  Some recent data might be hidden     Patient questions:  Do you have a fever, pain , or abdominal swelling? No. Pain Score  0 *  Have you tolerated food without any problems? Yes.    Have you been able to return to your normal activities? Yes.    Do you have any questions about your discharge instructions: Diet   No. Medications  No. Follow up visit  No.  Do you have questions or concerns about your Care? No.  Actions: * If pain score is 4 or above: No action needed, pain <4.

## 2016-11-09 NOTE — Telephone Encounter (Signed)
Left a message at [phone] for the pt to call us if any questions or concerns. maw

## 2016-11-15 ENCOUNTER — Encounter: Payer: Self-pay | Admitting: Gastroenterology

## 2017-01-28 ENCOUNTER — Telehealth: Payer: Self-pay | Admitting: Oncology

## 2017-01-28 NOTE — Telephone Encounter (Signed)
Lvm advising appt chgd from 2/19 (APP PAL) to 2/26 @ 10.15am.

## 2017-02-07 ENCOUNTER — Other Ambulatory Visit: Payer: BLUE CROSS/BLUE SHIELD

## 2017-02-07 ENCOUNTER — Ambulatory Visit: Payer: BLUE CROSS/BLUE SHIELD | Admitting: Nurse Practitioner

## 2017-02-11 ENCOUNTER — Telehealth: Payer: Self-pay | Admitting: Oncology

## 2017-02-11 NOTE — Telephone Encounter (Signed)
Patient requested to have appointment rescheduled per prior appointment conflict.

## 2017-02-14 ENCOUNTER — Other Ambulatory Visit: Payer: BLUE CROSS/BLUE SHIELD

## 2017-02-14 ENCOUNTER — Ambulatory Visit: Payer: BLUE CROSS/BLUE SHIELD | Admitting: Nurse Practitioner

## 2017-03-21 ENCOUNTER — Other Ambulatory Visit (HOSPITAL_BASED_OUTPATIENT_CLINIC_OR_DEPARTMENT_OTHER): Payer: BLUE CROSS/BLUE SHIELD

## 2017-03-21 ENCOUNTER — Ambulatory Visit (HOSPITAL_BASED_OUTPATIENT_CLINIC_OR_DEPARTMENT_OTHER): Payer: BLUE CROSS/BLUE SHIELD | Admitting: Oncology

## 2017-03-21 ENCOUNTER — Telehealth: Payer: Self-pay | Admitting: Oncology

## 2017-03-21 VITALS — BP 179/96 | HR 100 | Temp 98.5°F | Resp 16 | Wt 160.0 lb

## 2017-03-21 DIAGNOSIS — Z85038 Personal history of other malignant neoplasm of large intestine: Secondary | ICD-10-CM | POA: Diagnosis not present

## 2017-03-21 DIAGNOSIS — I10 Essential (primary) hypertension: Secondary | ICD-10-CM

## 2017-03-21 DIAGNOSIS — G620 Drug-induced polyneuropathy: Secondary | ICD-10-CM

## 2017-03-21 DIAGNOSIS — R918 Other nonspecific abnormal finding of lung field: Secondary | ICD-10-CM

## 2017-03-21 DIAGNOSIS — C187 Malignant neoplasm of sigmoid colon: Secondary | ICD-10-CM

## 2017-03-21 LAB — CEA (IN HOUSE-CHCC): CEA (CHCC-In House): 1.57 ng/mL (ref 0.00–5.00)

## 2017-03-21 NOTE — Progress Notes (Signed)
  [place] OFFICE PROGRESS NOTE   Diagnosis: Colon cancer  INTERVAL HISTORY:   Ms.Schwebach returns as scheduled. She feels well. No difficulty with bowel function. No complaint. Neuropathy symptoms have improved. She underwent a colonoscopy by Dr. Ardis Hughs in November 2017. A 4 mm polyp was removed from the transverse colon. The pathology revealed a tubular adenoma.   Objective:  Vital signs in last 24 hours:  Blood pressure (!) 179/96, pulse 100, temperature 98.5 F (36.9 C), temperature source Oral, resp. rate 16, weight 160 lb (72.6 kg), last menstrual period 07/28/2014, SpO2 99 %.    HEENT:  neck without mass Lymphatics:  no cervical, supraclavicular, axillary, or inguinal nodes Resp:  lungs clear bilaterally Cardio:  regular rate and rhythm GI:  no hepatosplenomegaly, no mass, nontender Vascular:  no leg edema   Lab Results:   CEA-pending  Medications: I have reviewed the patient's current medications.  Assessment/Plan: 1. 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, status post a robotic sigmoid colectomy 10/29/2015  1 of 16 lymph nodes positive for metastatic adenocarcinoma, lymphovascular invasion present  Microsatellite stable, no loss of mismatch repair protein expression  Cycle 1 adjuvant FOLFOX 12/03/2015  Cycle 2 adjuvant FOLFOX 12/17/2015  Cycle 3 adjuvant FOLFOX adjuvant FOLFOX 01/14/2016  Cycle 5 adjuvant FOLFOX 01/28/2016  Cycle 6 adjuvant FOLFOX 02/11/2016  Cycle 7 adjuvant FOLFOX 02/25/2016  Cycle 8 adjuvant FOLFOX 03/17/2016  Cycle 9 adjuvant FOLFOX 03/31/2016  Cycle 10 adjuvant FOLFOX 04/14/2016  Cycle 11 adjuvant FOLFOX 04/28/2016 (oxaliplatin deleted)  Cycle 12 adjuvant FOLFOX 05/12/2016 (oxaliplatin deleted)  CT 07/14/2016-no evidence of recurrent disease  Surveillance colonoscopy 11/08/2016-tubular adenoma removed from the transverse colon  2. Hypertension 3. G3  P3 4. Indeterminate bilateral pulmonary nodules on the staging chest CT 09/11/2015, stable on a CT 07/14/2016 5. Port-A-Cath placement by Dr. Barry Dienes 11/26/2015 6. Oxaliplatin neuropathy-improved    Disposition:   She remains in clinical remission from colon cancer. We will follow-up on the CEA from today. She will return for an office visit with restaging CTs and a CEA in 6 months.  15 minutes were spent with the patient today. The majority of time was used for counseling and coordination of care.  [name], MD  03/21/2017  12:56 PM

## 2017-03-21 NOTE — Telephone Encounter (Signed)
Gave patient AVS ans calender per 03/21/2017 los.

## 2017-03-22 ENCOUNTER — Telehealth: Payer: Self-pay | Admitting: *Deleted

## 2017-03-22 LAB — CEA: CEA: 1.6 ng/mL (ref 0.0–4.7)

## 2017-03-22 NOTE — Telephone Encounter (Signed)
Almyra Free ([place] interpreter) notified to notify pt that cea is normal per order of Dr. Benay Spice.

## 2017-03-22 NOTE — Telephone Encounter (Signed)
-----   Message from Ladell Pier, MD sent at 03/22/2017  7:13 AM EDT ----- Please call Almyra Free, spanish interpretor, cea is normal

## 2017-05-02 IMAGING — CR DG CHEST 1V PORT
1 series · 1 of 1 positions shown · non-contrast
Comparison: 10/27/2015 and earlier.

ADDENDUM:
The left chest porta cath tip is at the level of the carina which
corresponds to the mid SVC level.
CLINICAL DATA: 48-year-old female status post porta cath placement.
Colon carcinoma. Subsequent encounter.

EXAM:
PORTABLE CHEST 1 VIEW

[AP]
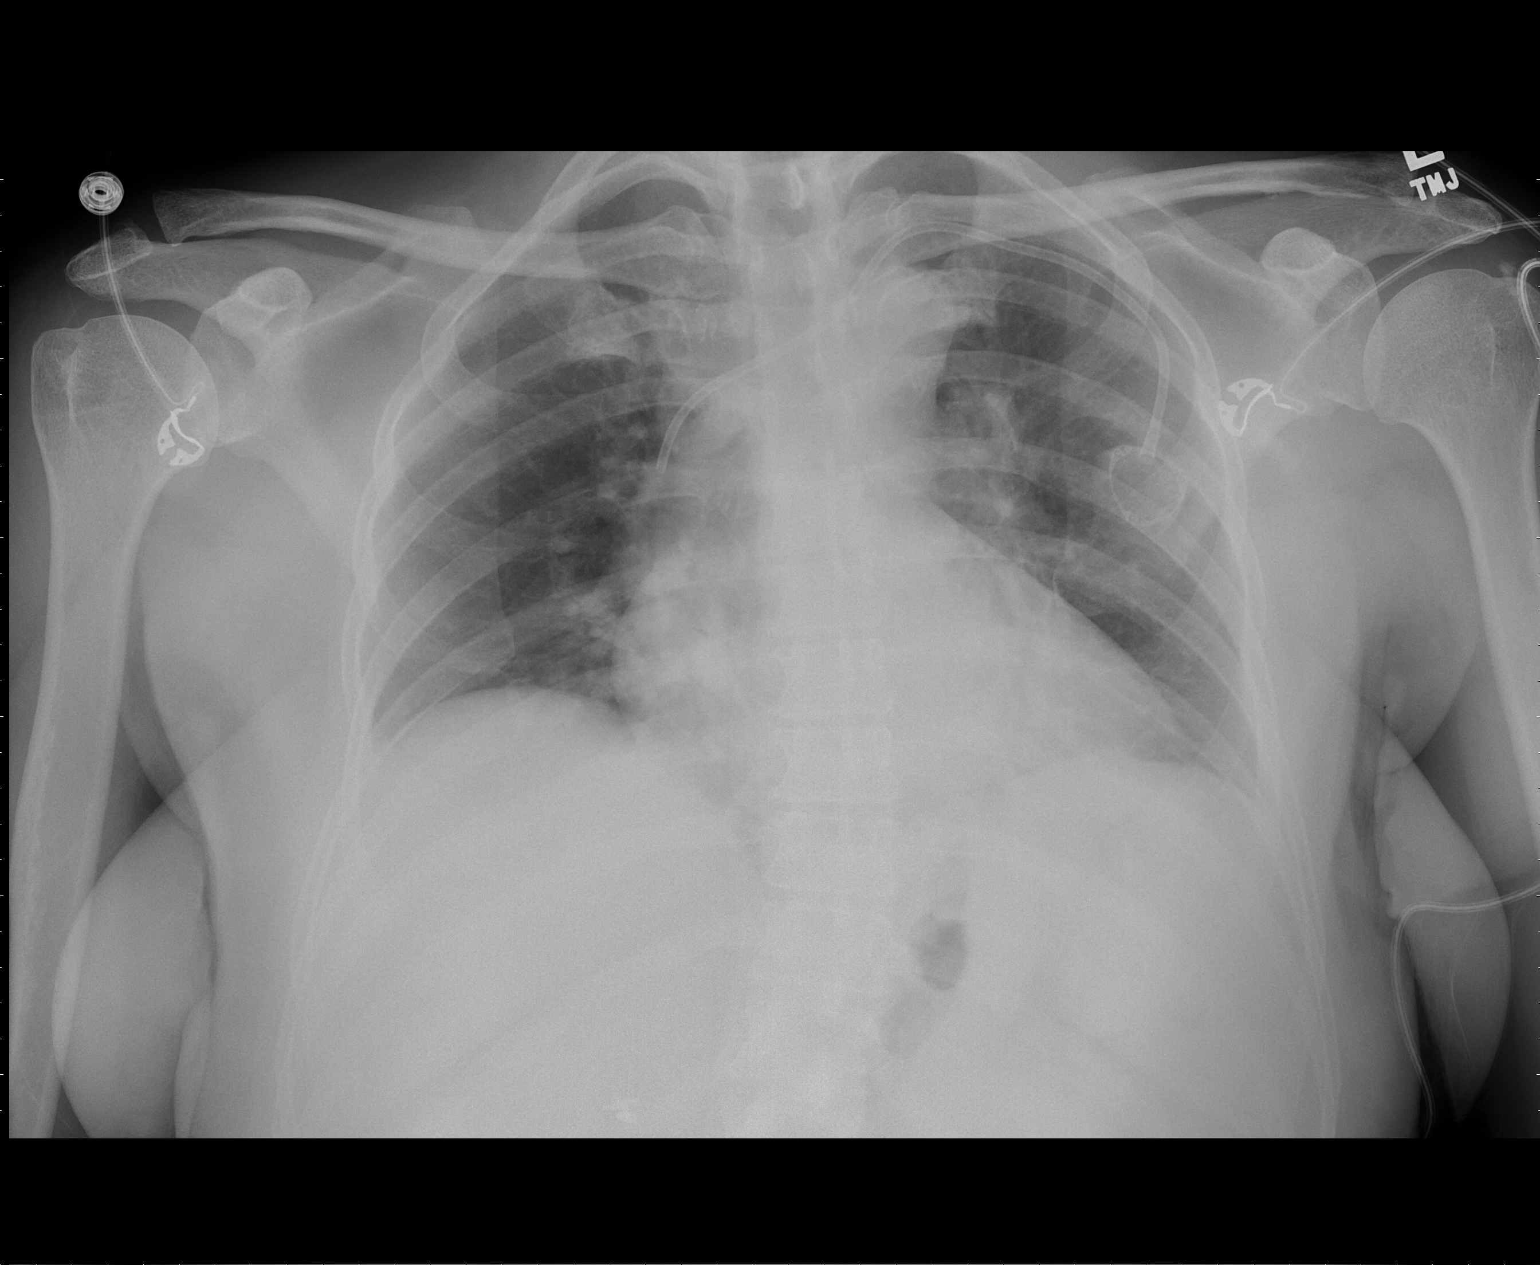

[1 of 1 positions shown; findings below may reference images not displayed]

FINDINGS: Portable AP semi upright view at 5573 hours. Left chest subclavian
approach porta cath. Catheter tip at the level of the carina. No
pneumothorax. Low lung volumes with crowding of markings. Stable
cardiac size and mediastinal contours. Stable cholecystectomy clips.
IMPRESSION: 1. Left subclavian approach porta cath placed with no adverse
features.
2. Low lung volumes, otherwise no acute cardiopulmonary abnormality.

## 2017-07-18 ENCOUNTER — Other Ambulatory Visit: Payer: BLUE CROSS/BLUE SHIELD

## 2017-07-20 ENCOUNTER — Ambulatory Visit: Payer: BLUE CROSS/BLUE SHIELD | Admitting: Nurse Practitioner

## 2017-10-10 ENCOUNTER — Encounter (HOSPITAL_COMMUNITY): Payer: Self-pay

## 2017-10-10 ENCOUNTER — Ambulatory Visit (HOSPITAL_COMMUNITY)
Admission: RE | Admit: 2017-10-10 | Discharge: 2017-10-10 | Disposition: A | Discharge status: Home / Self Care | Payer: BLUE CROSS/BLUE SHIELD | Source: Ambulatory Visit | Attending: Oncology | Admitting: Oncology

## 2017-10-10 ENCOUNTER — Other Ambulatory Visit (HOSPITAL_BASED_OUTPATIENT_CLINIC_OR_DEPARTMENT_OTHER): Payer: BLUE CROSS/BLUE SHIELD

## 2017-10-10 DIAGNOSIS — K7689 Other specified diseases of liver: Secondary | ICD-10-CM | POA: Insufficient documentation

## 2017-10-10 DIAGNOSIS — C187 Malignant neoplasm of sigmoid colon: Secondary | ICD-10-CM | POA: Diagnosis present

## 2017-10-10 DIAGNOSIS — I700 Atherosclerosis of aorta: Secondary | ICD-10-CM | POA: Insufficient documentation

## 2017-10-10 DIAGNOSIS — K429 Umbilical hernia without obstruction or gangrene: Secondary | ICD-10-CM | POA: Diagnosis not present

## 2017-10-10 DIAGNOSIS — Z85038 Personal history of other malignant neoplasm of large intestine: Secondary | ICD-10-CM

## 2017-10-10 DIAGNOSIS — K449 Diaphragmatic hernia without obstruction or gangrene: Secondary | ICD-10-CM | POA: Insufficient documentation

## 2017-10-10 DIAGNOSIS — Z9889 Other specified postprocedural states: Secondary | ICD-10-CM | POA: Diagnosis not present

## 2017-10-10 DIAGNOSIS — R918 Other nonspecific abnormal finding of lung field: Secondary | ICD-10-CM | POA: Insufficient documentation

## 2017-10-10 DIAGNOSIS — M5136 Other intervertebral disc degeneration, lumbar region: Secondary | ICD-10-CM | POA: Insufficient documentation

## 2017-10-10 DIAGNOSIS — I2510 Atherosclerotic heart disease of native coronary artery without angina pectoris: Secondary | ICD-10-CM | POA: Diagnosis not present

## 2017-10-10 DIAGNOSIS — Q453 Other congenital malformations of pancreas and pancreatic duct: Secondary | ICD-10-CM | POA: Insufficient documentation

## 2017-10-10 LAB — BASIC METABOLIC PANEL
ANION GAP: 11 meq/L (ref 3–11)
BUN: 10.9 mg/dL (ref 7.0–26.0)
CO2: 25 mEq/L (ref 22–29)
Calcium: 9.9 mg/dL (ref 8.4–10.4)
Chloride: 105 mEq/L (ref 98–109)
Creatinine: 0.9 mg/dL (ref 0.6–1.1)
Glucose: 107 mg/dl (ref 70–140)
Potassium: 4 mEq/L (ref 3.5–5.1)
Sodium: 142 mEq/L (ref 136–145)

## 2017-10-10 LAB — CEA (IN HOUSE-CHCC): CEA (CHCC-IN HOUSE): 1.64 ng/mL (ref 0.00–5.00)

## 2017-10-10 MED ORDER — IOPAMIDOL (ISOVUE-300) INJECTION 61%
100.0000 mL | Freq: Once | INTRAVENOUS | Status: AC | PRN
  Administered 2017-10-10: 100 mL via INTRAVENOUS

## 2017-10-10 MED ORDER — IOPAMIDOL (ISOVUE-300) INJECTION 61%
INTRAVENOUS | Status: AC
  Administered 2017-10-10: 100 mL via INTRAVENOUS
  Filled 2017-10-10: qty 100

## 2017-10-12 ENCOUNTER — Ambulatory Visit (HOSPITAL_BASED_OUTPATIENT_CLINIC_OR_DEPARTMENT_OTHER): Payer: BLUE CROSS/BLUE SHIELD | Admitting: Nurse Practitioner

## 2017-10-12 ENCOUNTER — Telehealth: Payer: Self-pay | Admitting: Oncology

## 2017-10-12 VITALS — BP 173/84 | HR 108 | Temp 97.9°F | Resp 18 | Ht 63.0 in | Wt 163.4 lb

## 2017-10-12 DIAGNOSIS — I10 Essential (primary) hypertension: Secondary | ICD-10-CM | POA: Diagnosis not present

## 2017-10-12 DIAGNOSIS — Z23 Encounter for immunization: Secondary | ICD-10-CM | POA: Diagnosis not present

## 2017-10-12 DIAGNOSIS — R918 Other nonspecific abnormal finding of lung field: Secondary | ICD-10-CM | POA: Diagnosis not present

## 2017-10-12 DIAGNOSIS — G620 Drug-induced polyneuropathy: Secondary | ICD-10-CM | POA: Diagnosis not present

## 2017-10-12 DIAGNOSIS — C187 Malignant neoplasm of sigmoid colon: Secondary | ICD-10-CM

## 2017-10-12 DIAGNOSIS — Z85038 Personal history of other malignant neoplasm of large intestine: Secondary | ICD-10-CM | POA: Diagnosis not present

## 2017-10-12 MED ORDER — INFLUENZA VAC SPLIT QUAD 0.5 ML IM SUSY
0.5000 mL | PREFILLED_SYRINGE | Freq: Once | INTRAMUSCULAR | Status: AC
  Administered 2017-10-12: 0.5 mL via INTRAMUSCULAR
  Filled 2017-10-12: qty 0.5

## 2017-10-12 NOTE — Telephone Encounter (Signed)
Gave avs and calendar for April 2019 °

## 2017-10-12 NOTE — Progress Notes (Signed)
[name] OFF[facility] PROGRESS NOTE   Diagnosis:  Colon cancer  INTERVAL HISTORY:   [name] returns as scheduled. She feels well. No change in bowel habits. No abdominal pain. No rectal bleeding. No nausea. She has a good appetite. No numbness or tingling in the hands or feet.  Objective:  Vital signs in last 24 hours:  Blood pressure (!) 173/84, pulse (!) 108, temperature 97.9 F (36.6 C), temperature source Oral, resp. rate 18, height _0  (1.6 m), weight 163 lb 6.4 oz (74.1 kg), last menstrual period 07/28/2014, SpO2 100 %.    HEENT: neck without mass. Lymphatics: no palpable cervical, supraclavicular, axillary or inguinal lymph nodes. Resp: lungs clear bilaterally. Cardio: regular rate and rhythm. GI: abdomen soft and nontender. No hepatomegaly. No mass. Vascular: no leg edema.   Lab Results:  Lab Results  Component Value Date   WBC 5.8 10/27/2016   HGB 15.5 (H) 10/27/2016   HCT 44.6 10/27/2016   MCV 89.2 10/27/2016   PLT 235 10/27/2016   NEUTROABS 3.9 05/12/2016    Imaging:  Ct Chest W Contrast  Result Date: 10/10/2017 CLINICAL DATA:  Sigmoid colon cancer diagnosed in September 2016, chemotherapy completed in May 2017. Surveillance restaging. EXAM: CT CHEST, ABDOMEN, AND PELVIS WITH CONTRAST TECHNIQUE: Multidetector CT imaging of the chest, abdomen and pelvis was performed following the standard protocol during bolus administration of intravenous contrast. CONTRAST:  100 cc Isovue 300 COMPARISON:  Multiple exams, including 07/14/2016 FINDINGS: CT CHEST FINDINGS Cardiovascular: Coronary and aortic arch atherosclerotic calcification. Mildly tortuous thoracic aorta. Mediastinum/Nodes: Small type 1 hiatal hernia. Lungs/Pleura: 5 by 4 mm right middle lobe pulmonary nodule on image 72/4, stable. 3 by 4 mm right upper lobe nodule along the right minor fissure, image 62/4, stable. No new nodules are identified. Musculoskeletal: Mild lower thoracic  spondylosis. CT ABDOMEN PELVIS FINDINGS Hepatobiliary: Several stable capsular/ subcapsular calcifications along the liver, benign in appearance. Cholecystectomy. Probable hepatic steatosis. Pancreas: Pancreas divisum with borderline prominence of pancreatic duct size in the pancreatic body and head. Spleen: Unremarkable Adrenals/Urinary Tract: Unremarkable Stomach/Bowel: Postoperative findings in the sigmoid colon. Otherwise unremarkable. Vascular/Lymphatic: Aortoiliac atherosclerotic vascular disease. Reproductive: Unremarkable Other: No supplemental non-categorized findings. Musculoskeletal: Lower lumbar degenerative disc disease. Small umbilical hernia contains adipose tissue, image 94/6. Mild degenerative arthropathy of both hips. IMPRESSION: 1. No findings of recurrent malignancy. Small right middle lobe and right upper lobe nodules near the minor fissure are unchanged. 2. Other imaging findings of potential clinical significance: Aortic Atherosclerosis (ICD10-I70.0). Coronary atherosclerosis. Small type 1 hiatal hernia. Lower lumbar degenerative disc disease. Several small benign-appearing subcapsular calcifications along the liver. Pancreas divisum. Postoperative findings in the sigmoid colon. Small umbilical hernia containing adipose tissue. Electronically Signed   By: Van Clines M.D.   On: 10/10/2017 10:58   Ct Abdomen Pelvis W Contrast  Result Date: 10/10/2017 CLINICAL DATA:  Sigmoid colon cancer diagnosed in September 2016, chemotherapy completed in May 2017. Surveillance restaging. EXAM: CT CHEST, ABDOMEN, AND PELVIS WITH CONTRAST TECHNIQUE: Multidetector CT imaging of the chest, abdomen and pelvis was performed following the standard protocol during bolus administration of intravenous contrast. CONTRAST:  100 cc Isovue 300 COMPARISON:  Multiple exams, including 07/14/2016 FINDINGS: CT CHEST FINDINGS Cardiovascular: Coronary and aortic arch atherosclerotic calcification. Mildly tortuous  thoracic aorta. Mediastinum/Nodes: Small type 1 hiatal hernia. Lungs/Pleura: 5 by 4 mm right middle lobe pulmonary nodule on image 72/4, stable. 3 by 4 mm right upper lobe nodule along the right minor fissure, image 62/4, stable.  No new nodules are identified. Musculoskeletal: Mild lower thoracic spondylosis. CT ABDOMEN PELVIS FINDINGS Hepatobiliary: Several stable capsular/ subcapsular calcifications along the liver, benign in appearance. Cholecystectomy. Probable hepatic steatosis. Pancreas: Pancreas divisum with borderline prominence of pancreatic duct size in the pancreatic body and head. Spleen: Unremarkable Adrenals/Urinary Tract: Unremarkable Stomach/Bowel: Postoperative findings in the sigmoid colon. Otherwise unremarkable. Vascular/Lymphatic: Aortoiliac atherosclerotic vascular disease. Reproductive: Unremarkable Other: No supplemental non-categorized findings. Musculoskeletal: Lower lumbar degenerative disc disease. Small umbilical hernia contains adipose tissue, image 94/6. Mild degenerative arthropathy of both hips. IMPRESSION: 1. No findings of recurrent malignancy. Small right middle lobe and right upper lobe nodules near the minor fissure are unchanged. 2. Other imaging findings of potential clinical significance: Aortic Atherosclerosis (ICD10-I70.0). Coronary atherosclerosis. Small type 1 hiatal hernia. Lower lumbar degenerative disc disease. Several small benign-appearing subcapsular calcifications along the liver. Pancreas divisum. Postoperative findings in the sigmoid colon. Small umbilical hernia containing adipose tissue. Electronically Signed   By: Van Clines M.D.   On: 10/10/2017 10:58    Medications: I have reviewed the patient's current medications.  Assessment/Plan: 1. 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, status post a robotic sigmoid colectomy 10/29/2015  1 of 16 lymph nodes positive for metastatic adenocarcinoma, lymphovascular  invasion present  Microsatellite stable, no loss of mismatch repair protein expression  Cycle 1 adjuvant FOLFOX 12/03/2015  Cycle 2 adjuvant FOLFOX 12/17/2015  Cycle 3 adjuvant FOLFOX adjuvant FOLFOX 01/14/2016  Cycle 5 adjuvant FOLFOX 01/28/2016  Cycle 6 adjuvant FOLFOX 02/11/2016  Cycle 7 adjuvant FOLFOX 02/25/2016  Cycle 8 adjuvant FOLFOX 03/17/2016  Cycle 9 adjuvant FOLFOX 03/31/2016  Cycle 10 adjuvant FOLFOX 04/14/2016  Cycle 11 adjuvant FOLFOX 04/28/2016 (oxaliplatin deleted)  Cycle 12 adjuvant FOLFOX 05/12/2016 (oxaliplatin deleted)  CT 07/14/2016-no evidence of recurrent disease  Surveillance colonoscopy 11/08/2016-tubular adenoma removed from the transverse colon   10/10/2017 CT chest/abdomen/pelvis-no evidence of recurrent disease. 2. Hypertension 3. G3 P3 4. Indeterminate bilateral pulmonary nodules on the staging chest CT 09/11/2015, stable on a CT 07/14/2016, stable on CT 10/10/2017 5. Port-A-Cath placement by Dr. Barry Dienes 11/26/2015 6. Oxaliplatin neuropathy-improved   Disposition: Ms. Simerson appears stable. She remains in clinical and radiographic remission from the colon cancer. She will return for a follow-up visit and CEA in 6 months. She will contact the office in the interim with any problems.  Influenza vaccine administered at today's visit.    Ned Card ANP/GNP-BC   10/12/2017  9:59 AM

## 2018-03-20 ENCOUNTER — Telehealth: Payer: Self-pay | Admitting: *Deleted

## 2018-03-20 NOTE — Telephone Encounter (Signed)
[name], [place] language interpreter called to report pt needs Monday appointments due to her work schedule. Message to schedulers to move appts to 4/29. [name] will call pt.

## 2018-03-23 ENCOUNTER — Ambulatory Visit: Payer: BLUE CROSS/BLUE SHIELD | Admitting: Nurse Practitioner

## 2018-04-13 ENCOUNTER — Ambulatory Visit: Payer: BLUE CROSS/BLUE SHIELD | Admitting: Oncology

## 2018-04-13 ENCOUNTER — Other Ambulatory Visit: Payer: BLUE CROSS/BLUE SHIELD

## 2018-04-17 ENCOUNTER — Inpatient Hospital Stay: Payer: BLUE CROSS/BLUE SHIELD | Attending: Oncology

## 2018-04-17 ENCOUNTER — Inpatient Hospital Stay (HOSPITAL_BASED_OUTPATIENT_CLINIC_OR_DEPARTMENT_OTHER): Payer: BLUE CROSS/BLUE SHIELD | Admitting: Oncology

## 2018-04-17 ENCOUNTER — Telehealth: Payer: Self-pay | Admitting: Oncology

## 2018-04-17 VITALS — BP 191/100 | HR 99 | Temp 98.9°F | Resp 18 | Ht 63.0 in | Wt 166.4 lb

## 2018-04-17 DIAGNOSIS — C778 Secondary and unspecified malignant neoplasm of lymph nodes of multiple regions: Secondary | ICD-10-CM | POA: Diagnosis not present

## 2018-04-17 DIAGNOSIS — C187 Malignant neoplasm of sigmoid colon: Secondary | ICD-10-CM | POA: Diagnosis not present

## 2018-04-17 DIAGNOSIS — C779 Secondary and unspecified malignant neoplasm of lymph node, unspecified: Secondary | ICD-10-CM | POA: Insufficient documentation

## 2018-04-17 DIAGNOSIS — R918 Other nonspecific abnormal finding of lung field: Secondary | ICD-10-CM

## 2018-04-17 DIAGNOSIS — I10 Essential (primary) hypertension: Secondary | ICD-10-CM | POA: Insufficient documentation

## 2018-04-17 LAB — CEA (IN HOUSE-CHCC): CEA (CHCC-In House): 1.6 ng/mL (ref 0.00–5.00)

## 2018-04-17 NOTE — Progress Notes (Signed)
  [place] OFFICE PROGRESS NOTE   Diagnosis: Colon cancer  INTERVAL HISTORY:   [name] returns as scheduled.  She feels well.  Good appetite.  No complaint.  No neuropathy symptoms.  Objective: She is seen today with a Spanish interpreter  Vital signs in last 24 hours:  Blood pressure (!) 191/100, pulse 99, temperature 98.9 F (37.2 C), temperature source Oral, resp. rate 18, height '5\' 3"'$  (1.6 m), weight 166 lb 6.4 oz (75.5 kg), last menstrual period 07/28/2014, SpO2 100 %.    HEENT: Neck without mass Lymphatics: No cervical, supraclavicular, axillary, or inguinal nodes Resp: Lungs clear bilaterally Cardio: Regular rate and rhythm GI: No hepatomegaly, no mass, nontender Vascular: No leg edema   Lab Results:  Lab Results  Component Value Date   CEA1 1.64 10/10/2017    Medications: I have reviewed the patient's current medications.   Assessment/Plan: 1. 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, status post a robotic sigmoid colectomy 10/29/2015  1 of 16 lymph nodes positive for metastatic adenocarcinoma, lymphovascular invasion present  Microsatellite stable, no loss of mismatch repair protein expression  Cycle 1 adjuvant FOLFOX 12/03/2015  Cycle 2 adjuvant FOLFOX 12/17/2015  Cycle 3 adjuvant FOLFOX adjuvant FOLFOX 01/14/2016  Cycle 5 adjuvant FOLFOX 01/28/2016  Cycle 6 adjuvant FOLFOX 02/11/2016  Cycle 7 adjuvant FOLFOX 02/25/2016  Cycle 8 adjuvant FOLFOX 03/17/2016  Cycle 9 adjuvant FOLFOX 03/31/2016  Cycle 10 adjuvant FOLFOX 04/14/2016  Cycle 11 adjuvant FOLFOX 04/28/2016 (oxaliplatin deleted)  Cycle 12 adjuvant FOLFOX 05/12/2016 (oxaliplatin deleted)  CT 07/14/2016-no evidence of recurrent disease  Surveillance colonoscopy 11/08/2016-tubular adenoma removed from the transverse colon   10/10/2017 CT chest/abdomen/pelvis-no evidence of recurrent disease. 2. Hypertension 3. G3  P3 4. Indeterminate bilateral pulmonary nodules on the staging chest CT 09/11/2015, stable on a CT 07/14/2016, stable on CT 10/10/2017 5. Port-A-Cath placement by Dr. Barry Dienes 11/26/2015 6. Oxaliplatin neuropathy- resolved  Disposition:  [name] is in clinical remission from colon cancer.  We will follow-up on the CEA from today.  She will return for an office visit and restaging CT evaluation in 6 months.  15 minutes were spent with the patient today.  The majority of the time was used for counseling and coordination of care.  [name], MD  04/17/2018  12:44 PM

## 2018-04-17 NOTE — Telephone Encounter (Signed)
Scheduled appt per 4/29 los - Gave patient AVS and calender per los. Central radiology to contact patient with ct .

## 2018-04-18 ENCOUNTER — Telehealth: Payer: Self-pay

## 2018-04-18 NOTE — Telephone Encounter (Addendum)
Spoke with Almyra Free the interpreter in regards to note below. Per Evert Kohl she will advise pt of the results and follow-up information.   ----- Message from Ladell Pier, MD sent at 04/17/2018  5:03 PM EDT ----- Please call patient, interpreter Almyra Free, CEA is normal, follow-up as scheduled

## 2018-10-23 ENCOUNTER — Other Ambulatory Visit: Payer: BLUE CROSS/BLUE SHIELD

## 2018-10-23 ENCOUNTER — Ambulatory Visit: Payer: BLUE CROSS/BLUE SHIELD | Admitting: Nurse Practitioner

## 2018-11-22 ENCOUNTER — Other Ambulatory Visit: Payer: Self-pay | Admitting: Oncology

## 2018-11-22 DIAGNOSIS — Z1231 Encounter for screening mammogram for malignant neoplasm of breast: Secondary | ICD-10-CM

## 2018-11-30 ENCOUNTER — Encounter: Payer: Self-pay | Admitting: Nurse Practitioner

## 2018-11-30 ENCOUNTER — Inpatient Hospital Stay: Payer: BLUE CROSS/BLUE SHIELD | Attending: Nurse Practitioner

## 2018-11-30 ENCOUNTER — Ambulatory Visit (HOSPITAL_COMMUNITY)
Admission: RE | Admit: 2018-11-30 | Discharge: 2018-11-30 | Disposition: A | Discharge status: Home / Self Care | Payer: BLUE CROSS/BLUE SHIELD | Source: Ambulatory Visit | Attending: Oncology | Admitting: Oncology

## 2018-11-30 ENCOUNTER — Telehealth: Payer: Self-pay | Admitting: Nurse Practitioner

## 2018-11-30 ENCOUNTER — Inpatient Hospital Stay (HOSPITAL_BASED_OUTPATIENT_CLINIC_OR_DEPARTMENT_OTHER): Payer: BLUE CROSS/BLUE SHIELD | Admitting: Nurse Practitioner

## 2018-11-30 VITALS — BP 176/93 | HR 94 | Temp 98.3°F | Resp 18 | Ht 63.5 in | Wt 164.1 lb

## 2018-11-30 DIAGNOSIS — C187 Malignant neoplasm of sigmoid colon: Secondary | ICD-10-CM | POA: Diagnosis not present

## 2018-11-30 DIAGNOSIS — Z9049 Acquired absence of other specified parts of digestive tract: Secondary | ICD-10-CM | POA: Diagnosis not present

## 2018-11-30 DIAGNOSIS — Z85038 Personal history of other malignant neoplasm of large intestine: Secondary | ICD-10-CM | POA: Diagnosis present

## 2018-11-30 DIAGNOSIS — Z9221 Personal history of antineoplastic chemotherapy: Secondary | ICD-10-CM | POA: Insufficient documentation

## 2018-11-30 DIAGNOSIS — I10 Essential (primary) hypertension: Secondary | ICD-10-CM | POA: Diagnosis not present

## 2018-11-30 LAB — BASIC METABOLIC PANEL - CANCER CENTER ONLY
Anion gap: 10 (ref 5–15)
BUN: 14 mg/dL (ref 6–20)
CALCIUM: 9.7 mg/dL (ref 8.9–10.3)
CHLORIDE: 108 mmol/L (ref 98–111)
CO2: 25 mmol/L (ref 22–32)
CREATININE: 0.87 mg/dL (ref 0.44–1.00)
GFR, Est AFR Am: >60 mL/min (ref >60)
GFR, Estimated: >60 mL/min (ref >60)
GLUCOSE: 104 mg/dL — AB (ref 70–99)
Potassium: 3.9 mmol/L (ref 3.5–5.1)
SODIUM: 143 mmol/L (ref 135–145)

## 2018-11-30 LAB — CEA (IN HOUSE-CHCC): CEA (CHCC-In House): 1.35 ng/mL (ref 0.00–5.00)

## 2018-11-30 MED ORDER — IOHEXOL 300 MG/ML  SOLN
100.0000 mL | Freq: Once | INTRAMUSCULAR | Status: AC | PRN
  Administered 2018-11-30: 100 mL via INTRAVENOUS

## 2018-11-30 MED ORDER — SODIUM CHLORIDE (PF) 0.9 % IJ SOLN
INTRAMUSCULAR | Status: AC
  Filled 2018-11-30: qty 50

## 2018-11-30 NOTE — Telephone Encounter (Signed)
Gave patient ave report and appointment schedule for June.

## 2018-11-30 NOTE — Progress Notes (Signed)
[place] OFFICE PROGRESS NOTE   Diagnosis: Colon cancer  INTERVAL HISTORY:   Ms. Bromell returns as scheduled.  He feels well.  No abdominal pain.  No change in bowel habits.  She denies bleeding.  No nausea or vomiting.  She has a good appetite.  Objective:  Vital signs in last 24 hours:  Blood pressure (!) 176/93, pulse 94, temperature 98.3 F (36.8 C), temperature source Oral, resp. rate 18, height 5' 3.5" (1.613 m), weight 164 lb 1.6 oz (74.4 kg), last menstrual period 07/28/2014, SpO2 100 %.    HEENT: Neck without mass. Lymphatics: No palpable cervical, supraclavicular, axillary or inguinal lymph nodes. Resp: Lungs clear bilaterally. Cardio: Regular rate and rhythm. GI: Abdomen soft and nontender.  No hepatomegaly. Vascular: No leg edema.   Lab Results:  Lab Results  Component Value Date   WBC 5.8 10/27/2016   HGB 15.5 (H) 10/27/2016   HCT 44.6 10/27/2016   MCV 89.2 10/27/2016   PLT 235 10/27/2016   NEUTROABS 3.9 05/12/2016    Imaging:  Ct Chest W Contrast  Result Date: 11/30/2018 CLINICAL DATA:  Colon cancer, chemotherapy completed in May 2017. EXAM: CT CHEST, ABDOMEN, AND PELVIS WITH CONTRAST TECHNIQUE: Multidetector CT imaging of the chest, abdomen and pelvis was performed following the standard protocol during bolus administration of intravenous contrast. CONTRAST:  134m OMNIPAQUE IOHEXOL 300 MG/ML  SOLN COMPARISON:  10/10/2017. FINDINGS: CT CHEST FINDINGS Cardiovascular: Atherosclerotic calcification of the aorta and coronary arteries. Heart is at the upper limits of normal in size to mildly enlarged. No pericardial effusion. Mediastinum/Nodes: No pathologically enlarged mediastinal, hilar or axillary lymph nodes. Esophagus is grossly unremarkable. Small hiatal hernia. Lungs/Pleura: Subpleural nodules measure up to 5 mm bilaterally, are unchanged and likely subpleural lymph nodes. No new pulmonary nodules. No pleural fluid. Airway is  unremarkable. Musculoskeletal: No worrisome lytic or sclerotic lesions. CT ABDOMEN PELVIS FINDINGS Hepatobiliary: Liver appears slightly decreased in attenuation diffusely. Calcified lesions along the liver capsule are subcentimeter in size and unchanged. Cholecystectomy. No biliary ductal dilatation. Pancreas: Negative. Spleen: Negative. Adrenals/Urinary Tract: Adrenal glands and kidneys are unremarkable. Ureters are decompressed. Bladder is low in volume. Stomach/Bowel: Small hiatal hernia. Stomach, small bowel, appendix and majority of the colon are unremarkable. An anastomosis is seen in the sigmoid colon. Vascular/Lymphatic: Atherosclerotic calcification of the arterial vasculature without aneurysm. Portacaval lymph node measures 10 mm, unchanged. No pathologically enlarged lymph nodes. Reproductive: Uterus is visualized.  No adnexal mass. Other: No free fluid. Small periumbilical hernia contains fat. Mesenteries and peritoneum are otherwise unremarkable. Musculoskeletal: No worrisome lytic or sclerotic lesions. IMPRESSION: 1. No evidence of metastatic disease in the chest, abdomen or pelvis. 2. Hepatic steatosis. 3. Aortic atherosclerosis (ICD10-170.0). Coronary artery calcification. Electronically Signed   By: MLorin PicketM.D.   On: 11/30/2018 12:27   Ct Abdomen Pelvis W Contrast  Result Date: 11/30/2018 CLINICAL DATA:  Colon cancer, chemotherapy completed in May 2017. EXAM: CT CHEST, ABDOMEN, AND PELVIS WITH CONTRAST TECHNIQUE: Multidetector CT imaging of the chest, abdomen and pelvis was performed following the standard protocol during bolus administration of intravenous contrast. CONTRAST:  1053mOMNIPAQUE IOHEXOL 300 MG/ML  SOLN COMPARISON:  10/10/2017. FINDINGS: CT CHEST FINDINGS Cardiovascular: Atherosclerotic calcification of the aorta and coronary arteries. Heart is at the upper limits of normal in size to mildly enlarged. No pericardial effusion. Mediastinum/Nodes: No pathologically enlarged  mediastinal, hilar or axillary lymph nodes. Esophagus is grossly unremarkable. Small hiatal hernia. Lungs/Pleura: Subpleural nodules measure up to 5 mm  bilaterally, are unchanged and likely subpleural lymph nodes. No new pulmonary nodules. No pleural fluid. Airway is unremarkable. Musculoskeletal: No worrisome lytic or sclerotic lesions. CT ABDOMEN PELVIS FINDINGS Hepatobiliary: Liver appears slightly decreased in attenuation diffusely. Calcified lesions along the liver capsule are subcentimeter in size and unchanged. Cholecystectomy. No biliary ductal dilatation. Pancreas: Negative. Spleen: Negative. Adrenals/Urinary Tract: Adrenal glands and kidneys are unremarkable. Ureters are decompressed. Bladder is low in volume. Stomach/Bowel: Small hiatal hernia. Stomach, small bowel, appendix and majority of the colon are unremarkable. An anastomosis is seen in the sigmoid colon. Vascular/Lymphatic: Atherosclerotic calcification of the arterial vasculature without aneurysm. Portacaval lymph node measures 10 mm, unchanged. No pathologically enlarged lymph nodes. Reproductive: Uterus is visualized.  No adnexal mass. Other: No free fluid. Small periumbilical hernia contains fat. Mesenteries and peritoneum are otherwise unremarkable. Musculoskeletal: No worrisome lytic or sclerotic lesions. IMPRESSION: 1. No evidence of metastatic disease in the chest, abdomen or pelvis. 2. Hepatic steatosis. 3. Aortic atherosclerosis (ICD10-170.0). Coronary artery calcification. Electronically Signed   By: Lorin Picket M.D.   On: 11/30/2018 12:27    Medications: I have reviewed the patient's current medications.  Assessment/Plan: 1. 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, status post a robotic sigmoid colectomy 10/29/2015  1 of 16 lymph nodes positive for metastatic adenocarcinoma, lymphovascular invasion present  Microsatellite stable, no loss of mismatch repair protein  expression  Cycle 1 adjuvant FOLFOX 12/03/2015  Cycle 2 adjuvant FOLFOX 12/17/2015  Cycle 3 adjuvant FOLFOX adjuvant FOLFOX 01/14/2016  Cycle 5 adjuvant FOLFOX 01/28/2016  Cycle 6 adjuvant FOLFOX 02/11/2016  Cycle 7 adjuvant FOLFOX 02/25/2016  Cycle 8 adjuvant FOLFOX 03/17/2016  Cycle 9 adjuvant FOLFOX 03/31/2016  Cycle 10 adjuvant FOLFOX 04/14/2016  Cycle 11 adjuvant FOLFOX 04/28/2016 (oxaliplatin deleted)  Cycle 12 adjuvant FOLFOX 05/12/2016 (oxaliplatin deleted)  CT 07/14/2016-no evidence of recurrent disease  Surveillance colonoscopy 11/08/2016-tubular adenoma removed from the transverse colon  10/10/2017 CT chest/abdomen/pelvis-no evidence of recurrent disease.  11/30/2018 CTs-no metastatic disease. 2. Hypertension 3. G3 P3 4. Indeterminate bilateral pulmonary nodules on the staging chest CT 09/11/2015, stable on a CT 07/14/2016, stable on CT 10/10/2017 5. Port-A-Cath placement by Dr. Barry Dienes 11/26/2015 6. Oxaliplatin neuropathy- resolved  Disposition: Ms. Galambos remains in clinical remission from colon cancer.  The surveillance CT scans from earlier today show no evidence of recurrent/metastatic disease.  CEA is in normal range.  She will continue surveillance colonoscopy with Dr. Ardis Hughs.  She will return for a CEA and follow-up visit in 6 months.  She will contact the office in the interim with any problems.    Ned Card ANP/GNP-BC   11/30/2018  2:17 PM

## 2019-01-01 ENCOUNTER — Ambulatory Visit: Payer: BLUE CROSS/BLUE SHIELD

## 2019-01-16 ENCOUNTER — Ambulatory Visit
Admission: RE | Admit: 2019-01-16 | Discharge: 2019-01-16 | Disposition: A | Discharge status: Home / Self Care | Payer: BLUE CROSS/BLUE SHIELD | Source: Ambulatory Visit | Attending: Oncology | Admitting: Oncology

## 2019-01-16 DIAGNOSIS — Z1231 Encounter for screening mammogram for malignant neoplasm of breast: Secondary | ICD-10-CM

## 2019-01-17 ENCOUNTER — Other Ambulatory Visit: Payer: Self-pay | Admitting: Oncology

## 2019-01-17 DIAGNOSIS — R928 Other abnormal and inconclusive findings on diagnostic imaging of breast: Secondary | ICD-10-CM

## 2019-01-26 ENCOUNTER — Ambulatory Visit: Payer: BLUE CROSS/BLUE SHIELD

## 2019-01-26 ENCOUNTER — Ambulatory Visit
Admission: RE | Admit: 2019-01-26 | Discharge: 2019-01-26 | Disposition: A | Discharge status: Home / Self Care | Payer: BLUE CROSS/BLUE SHIELD | Source: Ambulatory Visit | Attending: Oncology | Admitting: Oncology

## 2019-01-26 DIAGNOSIS — R928 Other abnormal and inconclusive findings on diagnostic imaging of breast: Secondary | ICD-10-CM

## 2019-05-31 ENCOUNTER — Other Ambulatory Visit: Payer: Self-pay

## 2019-05-31 ENCOUNTER — Telehealth: Payer: Self-pay | Admitting: Oncology

## 2019-05-31 ENCOUNTER — Telehealth: Payer: Self-pay | Admitting: *Deleted

## 2019-05-31 ENCOUNTER — Inpatient Hospital Stay: Payer: BC Managed Care – PPO

## 2019-05-31 ENCOUNTER — Inpatient Hospital Stay: Payer: BC Managed Care – PPO | Attending: Oncology | Admitting: Oncology

## 2019-05-31 VITALS — BP 164/82 | HR 93 | Temp 99.2°F | Resp 17 | Ht 63.5 in | Wt 159.5 lb

## 2019-05-31 DIAGNOSIS — R918 Other nonspecific abnormal finding of lung field: Secondary | ICD-10-CM | POA: Diagnosis not present

## 2019-05-31 DIAGNOSIS — C187 Malignant neoplasm of sigmoid colon: Secondary | ICD-10-CM | POA: Insufficient documentation

## 2019-05-31 DIAGNOSIS — I10 Essential (primary) hypertension: Secondary | ICD-10-CM | POA: Diagnosis not present

## 2019-05-31 DIAGNOSIS — C779 Secondary and unspecified malignant neoplasm of lymph node, unspecified: Secondary | ICD-10-CM | POA: Insufficient documentation

## 2019-05-31 LAB — CEA (IN HOUSE-CHCC): CEA (CHCC-In House): 1.3 ng/mL (ref 0.00–5.00)

## 2019-05-31 NOTE — Telephone Encounter (Signed)
Notified of normal CEA. She will call patient.

## 2019-05-31 NOTE — Telephone Encounter (Signed)
Scheduled appt per 6/11 los. A calendar will be mailed out. °

## 2019-05-31 NOTE — Progress Notes (Signed)
  [place] OFFICE PROGRESS NOTE   Diagnosis: Colon cancer  INTERVAL HISTORY:   Ms. Fryberger returns for scheduled visit.  She is here today with a Spanish language interpreter.  She feels well.  No difficulty with bowel function.  Good appetite and energy level.  No complaint.  Objective:  Vital signs in last 24 hours:  Blood pressure (!) 164/82, pulse 93, temperature 99.2 F (37.3 C), temperature source Oral, resp. rate 17, height 5' 3.5" (1.613 m), weight 159 lb 8 oz (72.3 kg), last menstrual period 07/28/2014, SpO2 100 %.   Limited examination secondary to distancing with the COVID pandemic Lymphatics: No cervical, supraclavicular, axillary, or inguinal nodes GI: No hepatomegaly, no mass, nontender Vascular: No leg edema     Lab Results:  Lab Results  Component Value Date   WBC 5.8 10/27/2016   HGB 15.5 (H) 10/27/2016   HCT 44.6 10/27/2016   MCV 89.2 10/27/2016   PLT 235 10/27/2016   NEUTROABS 3.9 05/12/2016    CMP  Lab Results  Component Value Date   NA 143 11/30/2018   K 3.9 11/30/2018   CL 108 11/30/2018   CO2 25 11/30/2018   GLUCOSE 104 (H) 11/30/2018   BUN 14 11/30/2018   CREATININE 0.87 11/30/2018   CALCIUM 9.7 11/30/2018   PROT 7.5 08/05/2016   ALBUMIN 4.2 08/05/2016   AST 21 08/05/2016   ALT 36 08/05/2016   ALKPHOS 104 08/05/2016   BILITOT 0.41 08/05/2016   GFRNONAA >60 11/30/2018   GFRAA >60 11/30/2018    Lab Results  Component Value Date   CEA1 1.35 11/30/2018     Medications: I have reviewed the patient's current medications.   Assessment/Plan: 1. 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, status post a robotic sigmoid colectomy 10/29/2015  1 of 16 lymph nodes positive for metastatic adenocarcinoma, lymphovascular invasion present  Microsatellite stable, no loss of mismatch repair protein expression  Cycle 1 adjuvant FOLFOX 12/03/2015  Cycle 2 adjuvant FOLFOX 12/17/2015   Cycle 3 adjuvant FOLFOX adjuvant FOLFOX 01/14/2016  Cycle 5 adjuvant FOLFOX 01/28/2016  Cycle 6 adjuvant FOLFOX 02/11/2016  Cycle 7 adjuvant FOLFOX 02/25/2016  Cycle 8 adjuvant FOLFOX 03/17/2016  Cycle 9 adjuvant FOLFOX 03/31/2016 backslash  Cycle 10 adjuvant FOLFOX 04/14/2016  Cycle 11 adjuvant FOLFOX 04/28/2016 (oxaliplatin deleted)  Cycle 12 adjuvant FOLFOX 05/12/2016 (oxaliplatin deleted)  CT 07/14/2016-no evidence of recurrent disease  Surveillance colonoscopy 11/08/2016-tubular adenoma removed from the transverse colon  10/10/2017 CT chest/abdomen/pelvis-no evidence of recurrent disease.  11/30/2018 CTs-no metastatic disease. 2. Hypertension 3. G3 P3 4. Indeterminate bilateral pulmonary nodules on the staging chest CT 09/11/2015, stable on a CT 07/14/2016, stable on CT 10/10/2017, stable lung nodules on CT 11/30/2018 5. Port-A-Cath placement by Dr. Barry Dienes 11/26/2015 6. Oxaliplatin neuropathy- resolved    Disposition: Ms. Putz is in clinical remission from colon cancer.  We will follow-up on the CEA from today.  She will return for an office visit and CEA in 6 months.  She is scheduled for a surveillance colonoscopy later this year.  [name], MD  05/31/2019  12:18 PM

## 2019-09-27 ENCOUNTER — Encounter: Payer: Self-pay | Admitting: Gastroenterology

## 2019-10-19 ENCOUNTER — Encounter: Payer: Self-pay | Admitting: Gastroenterology

## 2019-10-22 ENCOUNTER — Encounter: Payer: Self-pay | Admitting: Gastroenterology

## 2019-10-24 ENCOUNTER — Encounter: Payer: Self-pay | Admitting: Gastroenterology

## 2019-11-07 ENCOUNTER — Encounter: Payer: BC Managed Care – PPO | Admitting: Gastroenterology

## 2019-11-13 ENCOUNTER — Ambulatory Visit (AMBULATORY_SURGERY_CENTER): Payer: Self-pay | Admitting: *Deleted

## 2019-11-13 ENCOUNTER — Other Ambulatory Visit: Payer: Self-pay

## 2019-11-13 VITALS — Temp 97.8°F | Ht 63.0 in | Wt 164.2 lb

## 2019-11-13 DIAGNOSIS — Z86010 Personal history of colonic polyps: Secondary | ICD-10-CM

## 2019-11-13 DIAGNOSIS — Z85038 Personal history of other malignant neoplasm of large intestine: Secondary | ICD-10-CM

## 2019-11-13 DIAGNOSIS — Z1159 Encounter for screening for other viral diseases: Secondary | ICD-10-CM

## 2019-11-13 MED ORDER — NA SULFATE-K SULFATE-MG SULF 17.5-3.13-1.6 GM/177ML PO SOLN: 1.0000 | Freq: Once | ORAL | 0 refills | Status: AC

## 2019-11-13 NOTE — Progress Notes (Signed)

## 2019-11-13 NOTE — Progress Notes (Signed)
Luberta Robertson, assisted patient with instructions.

## 2019-11-22 ENCOUNTER — Other Ambulatory Visit: Payer: Self-pay | Admitting: Gastroenterology

## 2019-11-22 ENCOUNTER — Ambulatory Visit (INDEPENDENT_AMBULATORY_CARE_PROVIDER_SITE_OTHER): Payer: Self-pay

## 2019-11-22 DIAGNOSIS — Z1159 Encounter for screening for other viral diseases: Secondary | ICD-10-CM

## 2019-11-23 LAB — SARS CORONAVIRUS 2 (TAT 6-24 HRS): SARS Coronavirus 2: NEGATIVE

## 2019-11-26 ENCOUNTER — Telehealth: Payer: Self-pay | Admitting: Gastroenterology

## 2019-11-26 NOTE — Telephone Encounter (Signed)
Left multiple messages for patient to call back with insurance information. Patient has not called back to give [place] her insurance information. Patient's colonoscopy is scheduled for 11/27/2019 at 8am June in [facility] is aware.

## 2019-11-27 ENCOUNTER — Other Ambulatory Visit: Payer: Self-pay

## 2019-11-27 ENCOUNTER — Ambulatory Visit (AMBULATORY_SURGERY_CENTER): Payer: Self-pay | Admitting: Gastroenterology

## 2019-11-27 ENCOUNTER — Encounter: Payer: Self-pay | Admitting: Gastroenterology

## 2019-11-27 VITALS — BP 145/85 | HR 69 | Temp 98.0°F | Resp 16 | Ht 63.0 in | Wt 164.0 lb

## 2019-11-27 DIAGNOSIS — Z85038 Personal history of other malignant neoplasm of large intestine: Secondary | ICD-10-CM

## 2019-11-27 MED ORDER — SODIUM CHLORIDE 0.9 % IV SOLN: 500.0000 mL | Freq: Once | INTRAVENOUS | Status: DC

## 2019-11-27 NOTE — Op Note (Signed)
[name] Procedure Date: 11/27/2019 7:50 AM MRN: [id number] Endoscopist: Milus Banister , MD Age: 53 Referring MD:  Date of Birth: 1966/03/29 Gender: Female Account #: [id number] Procedure:                Colonoscopy Indications:              High risk colon cancer surveillance: Personal                            history of colon cancer; T3N1 sigmoid colon cancer;                            diagnosed 2016 Dr. Ardis Hughs colonoscopy; S/p                            segmental resection 10/2015 Dr. Barry Dienes, adjuvant                            chemo with Dr. Benay Spice. Colonoscopy 2017 single                            subCM adenoma removed. Medicines:                Monitored Anesthesia Care Procedure:                Pre-Anesthesia Assessment:                           - Prior to the procedure, a History and Physical                            was performed, and patient medications and                            allergies were reviewed. The patient's tolerance of                            previous anesthesia was also reviewed. The risks                            and benefits of the procedure and the sedation                            options and risks were discussed with the patient.                            All questions were answered, and informed consent                            was obtained. Prior Anticoagulants: The patient has                            taken no previous anticoagulant or antiplatelet  agents. ASA Grade Assessment: II - A patient with                            mild systemic disease. After reviewing the risks                            and benefits, the patient was deemed in                            satisfactory condition to undergo the procedure.                           After obtaining informed consent, the colonoscope                            was passed under direct vision. Throughout the                             procedure, the patient's blood pressure, pulse, and                            oxygen saturations were monitored continuously. The                            Colonoscope was introduced through the anus and                            advanced to the the cecum, identified by                            appendiceal orifice and ileocecal valve. The                            colonoscopy was performed without difficulty. The                            patient tolerated the procedure well. The quality                            of the bowel preparation was good. The ileocecal                            valve, appendiceal orifice, and rectum were                            photographed. Scope In: 7:55:33 AM Scope Out: 8:04:43 AM Scope Withdrawal Time: 0 hours 6 minutes 33 seconds  Total Procedure Duration: 0 hours 9 minutes 10 seconds  Findings:                 Normal end to end colonic anastomosis in the                            sigmoid region.  The entire examined colon was otherwise normal on                            direct and retroflexion views. Complications:            No immediate complications. Estimated blood loss:                            None. Estimated Blood Loss:     Estimated blood loss: none. Impression:               - Normal sigmoid anastomosis.                           - No polyps or cancers. Recommendation:           - Patient has a contact number available for                            emergencies. The signs and symptoms of potential                            delayed complications were discussed with the                            patient. Return to normal activities tomorrow.                            Written discharge instructions were provided to the                            patient.                           - Resume previous diet.                           - Continue present medications.                            - Repeat colonoscopy in 5 years for surveillance. Milus Banister, MD 11/27/2019 8:08:50 AM This report has been signed electronically.

## 2019-11-27 NOTE — Patient Instructions (Signed)
YOU HAD AN ENDOSCOPIC PROCEDURE TODAY AT [facility]:   Refer to the procedure report that was given to you for any specific questions about what was found during the examination.  If the procedure report does not answer your questions, please call your gastroenterologist to clarify.  If you requested that your care partner not be given the details of your procedure findings, then the procedure report has been included in a sealed envelope for you to review at your convenience later.  YOU SHOULD EXPECT: Some feelings of bloating in the abdomen. Passage of more gas than usual.  Walking can help get rid of the air that was put into your GI tract during the procedure and reduce the bloating. If you had a lower endoscopy (such as a colonoscopy or flexible sigmoidoscopy) you may notice spotting of blood in your stool or on the toilet paper. If you underwent a bowel prep for your procedure, you may not have a normal bowel movement for a few days.  Please Note:  You might notice some irritation and congestion in your nose or some drainage.  This is from the oxygen used during your procedure.  There is no need for concern and it should clear up in a day or so.  SYMPTOMS TO REPORT IMMEDIATELY:   Following lower endoscopy (colonoscopy or flexible sigmoidoscopy):  Excessive amounts of blood in the stool  Significant tenderness or worsening of abdominal pains  Swelling of the abdomen that is new, acute  Fever of 100F or higher   For urgent or emergent issues, a gastroenterologist can be reached at any hour by calling [phone].   DIET:  We do recommend a small meal at first, but then you may proceed to your regular diet.  Drink plenty of fluids but you should avoid alcoholic beverages for 24 hours.  ACTIVITY:  You should plan to take it easy for the rest of today and you should NOT DRIVE or use heavy machinery until tomorrow (because of the sedation medicines used during the test).     FOLLOW UP: Our staff will call the number listed on your records 48-72 hours following your procedure to check on you and address any questions or concerns that you may have regarding the information given to you following your procedure. If we do not reach you, we will leave a message.  We will attempt to reach you two times.  During this call, we will ask if you have developed any symptoms of COVID 19. If you develop any symptoms (ie: fever, flu-like symptoms, shortness of breath, cough etc.) before then, please call [phone].  If you test positive for Covid 19 in the 2 weeks post procedure, please call and report this information to [place].    If any biopsies were taken you will be contacted by phone or by letter within the next 1-3 weeks.  Please call us at [phone] if you have not heard about the biopsies in 3 weeks.    SIGNATURES/CONFIDENTIALITY: You and/or your care partner have signed paperwork which will be entered into your electronic medical record.  These signatures attest to the fact that that the information above on your After Visit Summary has been reviewed and is understood.  Full responsibility of the confidentiality of this discharge information lies with you and/or your care-partner.   Resume medications.

## 2019-11-27 NOTE — Progress Notes (Signed)
A and O x3. Report to RN. Tolerated MAC anesthesia well.

## 2019-11-27 NOTE — Progress Notes (Signed)
Pt's states no medical or surgical changes since previsit or office visit.  Ana Alvarex Perveda interprteter here today to help with admission to verify medical surgical history.   JB temps, JG Iv and CW vitals.

## 2019-11-29 ENCOUNTER — Telehealth: Payer: Self-pay | Admitting: *Deleted

## 2019-11-29 ENCOUNTER — Telehealth: Payer: Self-pay

## 2019-11-29 NOTE — Telephone Encounter (Signed)
  Follow up Call-  Call back number 11/27/2019  Post procedure Call Back phone  # [phone]  Permission to leave phone message Yes  Some recent data might be hidden     Patient questions:  Do you have a fever, pain , or abdominal swelling? No. Pain Score  0 *  Have you tolerated food without any problems? Yes.    Have you been able to return to your normal activities? Yes.    Do you have any questions about your discharge instructions: Diet   No. Medications  No. Follow up visit  No.  Do you have questions or concerns about your Care? No.  Actions: * If pain score is 4 or above: No action needed, pain <4.  1. Have you developed a fever since your procedure? no  2.   Have you had an respiratory symptoms (SOB or cough) since your procedure? no  3.   Have you tested positive for COVID 19 since your procedure no  4.   Have you had any family members/close contacts diagnosed with the COVID 19 since your procedure?  no   If yes to any of these questions please route to Joylene John, RN and Alphonsa Gin, Therapist, sports.

## 2019-11-29 NOTE — Telephone Encounter (Signed)
Follow up call made, no opportunity to leave a message. 

## 2019-11-30 ENCOUNTER — Inpatient Hospital Stay: Payer: Self-pay | Attending: Nurse Practitioner

## 2019-11-30 ENCOUNTER — Telehealth: Payer: Self-pay | Admitting: *Deleted

## 2019-11-30 ENCOUNTER — Other Ambulatory Visit: Payer: Self-pay

## 2019-11-30 ENCOUNTER — Encounter: Payer: Self-pay | Admitting: Nurse Practitioner

## 2019-11-30 ENCOUNTER — Inpatient Hospital Stay (HOSPITAL_BASED_OUTPATIENT_CLINIC_OR_DEPARTMENT_OTHER): Payer: Self-pay | Admitting: Nurse Practitioner

## 2019-11-30 VITALS — BP 160/100 | HR 90 | Temp 98.2°F | Resp 19 | Ht 63.0 in | Wt 164.1 lb

## 2019-11-30 DIAGNOSIS — C187 Malignant neoplasm of sigmoid colon: Secondary | ICD-10-CM | POA: Insufficient documentation

## 2019-11-30 DIAGNOSIS — C779 Secondary and unspecified malignant neoplasm of lymph node, unspecified: Secondary | ICD-10-CM | POA: Insufficient documentation

## 2019-11-30 DIAGNOSIS — R918 Other nonspecific abnormal finding of lung field: Secondary | ICD-10-CM | POA: Insufficient documentation

## 2019-11-30 DIAGNOSIS — I10 Essential (primary) hypertension: Secondary | ICD-10-CM | POA: Insufficient documentation

## 2019-11-30 LAB — CEA (IN HOUSE-CHCC): CEA (CHCC-In House): 1.39 ng/mL (ref 0.00–5.00)

## 2019-11-30 NOTE — Telephone Encounter (Signed)
Per Ned Card, NP, called Almyra Free interpreter to make pt aware that her CEA was normal and to f/u as scheduled. Interpreter stated, pt verbalized understanding and was appreciative.

## 2019-11-30 NOTE — Progress Notes (Signed)
  [place] OFFICE PROGRESS NOTE   Diagnosis: Colon cancer  INTERVAL HISTORY:   Brittney [name] returns as scheduled.  She is accompanied by a [place] language interpreter.  She feels well.  No change in bowel habits.  No bleeding with bowel movements.  She denies abdominal pain.  No nausea or vomiting.  She has a good appetite.  No unusual headaches or vision change.  No fever, cough, shortness of breath.  Objective:  Vital signs in last 24 hours:  Blood pressure (!) 160/100, pulse 90, temperature 98.2 F (36.8 C), temperature source Temporal, resp. rate 19, height '5\' 3"'$  (1.6 m), weight 164 lb 1.6 oz (74.4 kg), last menstrual period 07/28/2014, SpO2 100 %.    HEENT: Neck without mass. Lymphatics: No palpable cervical, supraclavicular, axillary or inguinal lymph nodes. Resp: Lungs clear bilaterally. Cardio: Regular rate and rhythm. GI: Abdomen soft and nontender.  No hepatomegaly. Vascular: No leg edema.    Lab Results:  Lab Results  Component Value Date   WBC 5.8 10/27/2016   HGB 15.5 (H) 10/27/2016   HCT 44.6 10/27/2016   MCV 89.2 10/27/2016   PLT 235 10/27/2016   NEUTROABS 3.9 05/12/2016    Imaging:  No results found.  Medications: I have reviewed the patient's current medications.  Assessment/Plan: 1. 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, status post a robotic sigmoid colectomy 10/29/2015  1 of 16 lymph nodes positive for metastatic adenocarcinoma, lymphovascular invasion present  Microsatellite stable, no loss of mismatch repair protein expression  Cycle 1 adjuvant FOLFOX 12/03/2015  Cycle 2 adjuvant FOLFOX 12/17/2015  Cycle 3 adjuvant FOLFOX adjuvant FOLFOX 01/14/2016  Cycle 5 adjuvant FOLFOX 01/28/2016  Cycle 6 adjuvant FOLFOX 02/11/2016  Cycle 7 adjuvant FOLFOX 02/25/2016  Cycle 8 adjuvant FOLFOX 03/17/2016  Cycle 9 adjuvant FOLFOX 03/31/2016 backslash  Cycle 10 adjuvant FOLFOX  04/14/2016  Cycle 11 adjuvant FOLFOX 04/28/2016 (oxaliplatin deleted)  Cycle 12 adjuvant FOLFOX 05/12/2016 (oxaliplatin deleted)  CT 07/14/2016-no evidence of recurrent disease  Surveillance colonoscopy 11/08/2016-tubular adenoma removed from the transverse colon  10/10/2017 CT chest/abdomen/pelvis-no evidence of recurrent disease.  11/30/2018 CTs-no metastatic disease  Surveillance colonoscopy 11/27/2019-normal sigmoid anastomosis.  No polyps or cancers. 2. Hypertension 3. G3 P3 4. Indeterminate bilateral pulmonary nodules on the staging chest CT 09/11/2015, stable on a CT 07/14/2016, stable on CT 10/10/2017, stable lung nodules on CT 11/30/2018 5. Port-A-Cath placement by Dr. Barry Dienes 11/26/2015 6. Oxaliplatin neuropathy-resolved    Disposition: Brittney [name] remains in clinical remission from colon cancer.  We will follow-up on the CEA from today.  She will return for a CEA and follow-up visit in 6 months.  Her blood pressure was elevated on multiple readings in the office today.  She reports elevated readings at doctor's appointments are not uncommon and blood pressure is normal at home.  She will follow-up with her PCP.    Ned Card ANP/GNP-BC   11/30/2019  12:13 PM

## 2019-12-03 ENCOUNTER — Telehealth: Payer: Self-pay | Admitting: Oncology

## 2019-12-03 NOTE — Telephone Encounter (Signed)
Called and left msg. Mailed printout  °

## 2020-04-26 ENCOUNTER — Ambulatory Visit: Payer: Self-pay | Attending: Internal Medicine

## 2020-04-26 DIAGNOSIS — Z23 Encounter for immunization: Secondary | ICD-10-CM

## 2020-04-26 NOTE — Progress Notes (Signed)
   Covid-19 Vaccination Clinic  Name:  Brittney [name]    MRN: [id number] DOB: 1966/08/31  04/26/2020  [name] was observed post Covid-19 immunization for 15 minutes without incident. She was provided with Vaccine Information Sheet and instruction to access the V-Safe system.   [name] was instructed to call 911 with any severe reactions post vaccine: Marland Kitchen Difficulty breathing  . Swelling of face and throat  . A fast heartbeat  . A bad rash all over body  . Dizziness and weakness   Immunizations Administered    Name Date Dose VIS Date Route   Pfizer COVID-19 Vaccine 04/26/2020 11:32 AM 0.3 mL 02/13/2019 Intramuscular   Manufacturer: [place]   Lot: [id number]   [place]: [id number]

## 2020-05-30 ENCOUNTER — Telehealth: Payer: Self-pay | Admitting: Nurse Practitioner

## 2020-05-30 ENCOUNTER — Other Ambulatory Visit: Payer: Self-pay

## 2020-05-30 ENCOUNTER — Inpatient Hospital Stay: Payer: 59 | Attending: Oncology | Admitting: Nurse Practitioner

## 2020-05-30 ENCOUNTER — Telehealth: Payer: Self-pay

## 2020-05-30 ENCOUNTER — Inpatient Hospital Stay: Payer: 59

## 2020-05-30 ENCOUNTER — Encounter: Payer: Self-pay | Admitting: Nurse Practitioner

## 2020-05-30 VITALS — BP 174/85 | HR 78 | Temp 97.7°F | Resp 20 | Ht 63.0 in | Wt 155.7 lb

## 2020-05-30 DIAGNOSIS — I10 Essential (primary) hypertension: Secondary | ICD-10-CM | POA: Insufficient documentation

## 2020-05-30 DIAGNOSIS — Z9049 Acquired absence of other specified parts of digestive tract: Secondary | ICD-10-CM | POA: Insufficient documentation

## 2020-05-30 DIAGNOSIS — Z79899 Other long term (current) drug therapy: Secondary | ICD-10-CM | POA: Diagnosis not present

## 2020-05-30 DIAGNOSIS — Z9221 Personal history of antineoplastic chemotherapy: Secondary | ICD-10-CM | POA: Diagnosis not present

## 2020-05-30 DIAGNOSIS — Z85038 Personal history of other malignant neoplasm of large intestine: Secondary | ICD-10-CM | POA: Insufficient documentation

## 2020-05-30 DIAGNOSIS — C187 Malignant neoplasm of sigmoid colon: Secondary | ICD-10-CM | POA: Diagnosis not present

## 2020-05-30 LAB — CEA (IN HOUSE-CHCC): CEA (CHCC-In House): 1.66 ng/mL (ref 0.00–5.00)

## 2020-05-30 NOTE — Telephone Encounter (Signed)
Patient being called by Brittney [name] to let her know that per Ned Card NP CEA is normal. Follow-up as scheduled.

## 2020-05-30 NOTE — Progress Notes (Addendum)
  [place] OFFICE PROGRESS NOTE   Diagnosis: Colon cancer  INTERVAL HISTORY:   [name] returns as scheduled.  She feels well.  No change in bowel habits.  No blood or pain with bowel movements.  She denies abdominal pain.  No nausea or vomiting.  She has a good appetite.  She reports intentional weight loss achieved by exercising.  No numbness or tingling in the hands or feet.  She reports normal blood pressure readings at home.  Objective:  Vital signs in last 24 hours:  Blood pressure (!) 174/85, pulse 78, temperature 97.7 F (36.5 C), temperature source Temporal, resp. rate 20, height '5\' 3"'$  (1.6 m), weight 155 lb 11.2 oz (70.6 kg), last menstrual period 07/28/2014, SpO2 100 %.    HEENT: Neck without mass. Lymphatics: No palpable cervical, supraclavicular, axillary or inguinal lymph nodes. Resp: Lungs clear bilaterally. Cardio: Regular rate and rhythm. GI: Abdomen soft and nontender.  No hepatomegaly. Vascular: No leg edema.    Lab Results:  Lab Results  Component Value Date   WBC 5.8 10/27/2016   HGB 15.5 (H) 10/27/2016   HCT 44.6 10/27/2016   MCV 89.2 10/27/2016   PLT 235 10/27/2016   NEUTROABS 3.9 05/12/2016    Imaging:  No results found.  Medications: I have reviewed the patient's current medications.  Assessment/Plan: 1. 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, status post a robotic sigmoid colectomy 10/29/2015  1 of 16 lymph nodes positive for metastatic adenocarcinoma, lymphovascular invasion present  Microsatellite stable, no loss of mismatch repair protein expression  Cycle 1 adjuvant FOLFOX 12/03/2015  Cycle 2 adjuvant FOLFOX 12/17/2015  Cycle 3 adjuvant FOLFOX adjuvant FOLFOX 01/14/2016  Cycle 5 adjuvant FOLFOX 01/28/2016  Cycle 6 adjuvant FOLFOX 02/11/2016  Cycle 7 adjuvant FOLFOX 02/25/2016  Cycle 8 adjuvant FOLFOX 03/17/2016  Cycle 9 adjuvant FOLFOX  04/12/2017backslash  Cycle 10 adjuvant FOLFOX 04/14/2016  Cycle 11 adjuvant FOLFOX 04/28/2016 (oxaliplatin deleted)  Cycle 12 adjuvant FOLFOX 05/12/2016 (oxaliplatin deleted)  CT 07/14/2016-no evidence of recurrent disease  Surveillance colonoscopy 11/08/2016-tubular adenoma removed from the transverse colon  10/10/2017 CT chest/abdomen/pelvis-no evidence of recurrent disease.  11/30/2018 CTs-no metastatic disease  Surveillance colonoscopy 11/27/2019-normal sigmoid anastomosis.  No polyps or cancers. 2. Hypertension 3. G3 P3 4. Indeterminate bilateral pulmonary nodules on the staging chest CT 09/11/2015, stable on a CT 07/14/2016, stable on CT 10/10/2017, stable lung nodules on CT 11/30/2018 5. Port-A-Cath placement by Dr. Barry Dienes 11/26/2015 6. Oxaliplatin neuropathy-resolved  Disposition: Brittney [name] appears well.  She remains in clinical remission from colon cancer.  We will follow-up on the CEA from today.  She will return for a CEA and follow-up visit in 6 months.  Patient seen with Dr. Benay Spice.  Spanish language interpreter present for today's visit.  Ned Card ANP/GNP-BC   05/30/2020  9:28 AM  This was a shared visit with Ned Card.  [name] is in remission from colon cancer.  She would like to continue follow-up at the Cancer center.  She will return for an office visit in 6 months.  Brittney Manson, MD

## 2020-05-30 NOTE — Addendum Note (Signed)
Addended by: [name] B on: 05/30/2020 05:27 PM   Modules accepted: Level of Service

## 2020-05-30 NOTE — Telephone Encounter (Signed)
Scheduled appt per 6/11 los.  Printed calendar and avs. 

## 2020-11-28 ENCOUNTER — Other Ambulatory Visit: Payer: Self-pay

## 2020-11-28 ENCOUNTER — Inpatient Hospital Stay: Payer: BC Managed Care – PPO | Attending: Oncology | Admitting: Oncology

## 2020-11-28 ENCOUNTER — Inpatient Hospital Stay: Payer: BC Managed Care – PPO

## 2020-11-28 VITALS — BP 171/93 | HR 87 | Temp 97.3°F | Resp 17 | Ht 63.0 in | Wt 150.7 lb

## 2020-11-28 DIAGNOSIS — Z79899 Other long term (current) drug therapy: Secondary | ICD-10-CM | POA: Diagnosis not present

## 2020-11-28 DIAGNOSIS — Z85038 Personal history of other malignant neoplasm of large intestine: Secondary | ICD-10-CM | POA: Insufficient documentation

## 2020-11-28 DIAGNOSIS — Z9221 Personal history of antineoplastic chemotherapy: Secondary | ICD-10-CM | POA: Insufficient documentation

## 2020-11-28 DIAGNOSIS — Z23 Encounter for immunization: Secondary | ICD-10-CM | POA: Diagnosis not present

## 2020-11-28 DIAGNOSIS — I10 Essential (primary) hypertension: Secondary | ICD-10-CM | POA: Insufficient documentation

## 2020-11-28 DIAGNOSIS — Z9049 Acquired absence of other specified parts of digestive tract: Secondary | ICD-10-CM | POA: Insufficient documentation

## 2020-11-28 DIAGNOSIS — C187 Malignant neoplasm of sigmoid colon: Secondary | ICD-10-CM | POA: Diagnosis not present

## 2020-11-28 LAB — CEA (IN HOUSE-CHCC): CEA (CHCC-In House): 1.67 ng/mL (ref 0.00–5.00)

## 2020-11-28 MED ORDER — INFLUENZA VAC SPLIT QUAD 0.5 ML IM SUSY
0.5000 mL | PREFILLED_SYRINGE | Freq: Once | INTRAMUSCULAR | Status: AC
  Administered 2020-11-28: 0.5 mL via INTRAMUSCULAR

## 2020-11-28 MED ORDER — INFLUENZA VAC SPLIT QUAD 0.5 ML IM SUSY
PREFILLED_SYRINGE | INTRAMUSCULAR | Status: AC
  Filled 2020-11-28: qty 0.5

## 2020-11-28 NOTE — Progress Notes (Signed)
Spanish interpreter, Lavon Paganini present for visit.

## 2020-11-28 NOTE — Progress Notes (Signed)
  [place] OFFICE PROGRESS NOTE   Diagnosis: Colon cancer  INTERVAL HISTORY:   Brittney [name] returns as scheduled.  She feels well.  Good appetite.  She reports intentional weight loss.  No difficulty with bowel function.  No complaint.  She is here today with a Spanish interpreter.  Objective:  Vital signs in last 24 hours:  Blood pressure (!) 171/93, pulse 87, temperature (!) 97.3 F (36.3 C), temperature source Tympanic, resp. rate 17, height 5' 3" (1.6 m), weight 150 lb 11.2 oz (68.4 kg), last menstrual period 07/28/2014, SpO2 100 %.    Lymphatics: No cervical, supraclavicular, axillary, or inguinal nodes Resp: Lungs clear bilaterally Cardio: Regular rate and rhythm GI: No hepatosplenomegaly, nontender, no mass Vascular: No leg edema     Lab Results:  Lab Results  Component Value Date   WBC 5.8 10/27/2016   HGB 15.5 (H) 10/27/2016   HCT 44.6 10/27/2016   MCV 89.2 10/27/2016   PLT 235 10/27/2016   NEUTROABS 3.9 05/12/2016    CMP  Lab Results  Component Value Date   NA 143 11/30/2018   K 3.9 11/30/2018   CL 108 11/30/2018   CO2 25 11/30/2018   GLUCOSE 104 (H) 11/30/2018   BUN 14 11/30/2018   CREATININE 0.87 11/30/2018   CALCIUM 9.7 11/30/2018   PROT 7.5 08/05/2016   ALBUMIN 4.2 08/05/2016   AST 21 08/05/2016   ALT 36 08/05/2016   ALKPHOS 104 08/05/2016   BILITOT 0.41 08/05/2016   GFRNONAA >60 11/30/2018   GFRAA >60 11/30/2018     Medications: I have reviewed the patient's current medications.   Assessment/Plan: 1. 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, status post a robotic sigmoid colectomy 10/29/2015  1 of 16 lymph nodes positive for metastatic adenocarcinoma, lymphovascular invasion present  Microsatellite stable, no loss of mismatch repair protein expression  Cycle 1 adjuvant FOLFOX 12/03/2015  Cycle 2 adjuvant FOLFOX 12/17/2015  Cycle 3 adjuvant FOLFOX adjuvant FOLFOX  01/14/2016  Cycle 5 adjuvant FOLFOX 01/28/2016  Cycle 6 adjuvant FOLFOX 02/11/2016  Cycle 7 adjuvant FOLFOX 02/25/2016  Cycle 8 adjuvant FOLFOX 03/17/2016  Cycle 9 adjuvant FOLFOX 04/12/2017backslash  Cycle 10 adjuvant FOLFOX 04/14/2016  Cycle 11 adjuvant FOLFOX 04/28/2016 (oxaliplatin deleted)  Cycle 12 adjuvant FOLFOX 05/12/2016 (oxaliplatin deleted)  CT 07/14/2016-no evidence of recurrent disease  Surveillance colonoscopy 11/08/2016-tubular adenoma removed from the transverse colon  10/10/2017 CT chest/abdomen/pelvis-no evidence of recurrent disease.  11/30/2018 CTs-no metastatic disease  Surveillance colonoscopy 11/27/2019-normal sigmoid anastomosis.  No polyps or cancers. 2. Hypertension 3. G3 P3 4. Indeterminate bilateral pulmonary nodules on the staging chest CT 09/11/2015, stable on a CT 07/14/2016, stable on CT 10/10/2017, stable lung nodules on CT 11/30/2018 5. Port-A-Cath placement by Dr. Barry Dienes 11/26/2015 6. Oxaliplatin neuropathy-resolved    Disposition:  Brittney [name] is in remission from colon cancer.  We will follow up on the CEA from today.  She would like to continue follow-up at the Cancer center.  She will return for an office visit in 1 year.  She continues colonoscopy surveillance with Dr. Ardis Hughs. She received an influenza vaccine today.  She will obtain the COVID-19 booster vaccine within the next few weeks.  Brittney Coder, MD  11/28/2020  9:50 AM

## 2021-11-20 ENCOUNTER — Encounter: Payer: Self-pay | Admitting: Oncology

## 2021-11-27 ENCOUNTER — Inpatient Hospital Stay: Payer: BC Managed Care – PPO

## 2021-11-27 ENCOUNTER — Other Ambulatory Visit (HOSPITAL_BASED_OUTPATIENT_CLINIC_OR_DEPARTMENT_OTHER): Payer: Self-pay

## 2021-11-27 ENCOUNTER — Other Ambulatory Visit: Payer: Self-pay

## 2021-11-27 ENCOUNTER — Encounter: Payer: Self-pay | Admitting: Oncology

## 2021-11-27 ENCOUNTER — Telehealth: Payer: Self-pay

## 2021-11-27 ENCOUNTER — Inpatient Hospital Stay: Payer: BC Managed Care – PPO | Attending: Nurse Practitioner | Admitting: Oncology

## 2021-11-27 ENCOUNTER — Other Ambulatory Visit: Payer: 59

## 2021-11-27 ENCOUNTER — Ambulatory Visit: Payer: BC Managed Care – PPO | Attending: Internal Medicine

## 2021-11-27 VITALS — BP 181/86 | HR 83 | Temp 97.8°F | Resp 18 | Ht 63.0 in | Wt 151.6 lb

## 2021-11-27 DIAGNOSIS — Z85038 Personal history of other malignant neoplasm of large intestine: Secondary | ICD-10-CM | POA: Insufficient documentation

## 2021-11-27 DIAGNOSIS — I10 Essential (primary) hypertension: Secondary | ICD-10-CM | POA: Insufficient documentation

## 2021-11-27 DIAGNOSIS — R918 Other nonspecific abnormal finding of lung field: Secondary | ICD-10-CM | POA: Diagnosis not present

## 2021-11-27 DIAGNOSIS — C187 Malignant neoplasm of sigmoid colon: Secondary | ICD-10-CM | POA: Diagnosis not present

## 2021-11-27 DIAGNOSIS — Z9049 Acquired absence of other specified parts of digestive tract: Secondary | ICD-10-CM | POA: Insufficient documentation

## 2021-11-27 DIAGNOSIS — Z23 Encounter for immunization: Secondary | ICD-10-CM

## 2021-11-27 LAB — CEA (ACCESS): CEA (CHCC): 1.41 ng/mL (ref 0.00–5.00)

## 2021-11-27 MED ORDER — FLUARIX QUADRIVALENT 0.5 ML IM SUSY
PREFILLED_SYRINGE | INTRAMUSCULAR | 0 refills | Status: DC
  Filled 2021-11-27: qty 0.5, 1d supply, fill #0

## 2021-11-27 MED ORDER — PFIZER COVID-19 VAC BIVALENT 30 MCG/0.3ML IM SUSP
INTRAMUSCULAR | 0 refills | Status: DC
  Filled 2021-11-27: qty 0.3, 1d supply, fill #0

## 2021-11-27 NOTE — Telephone Encounter (Signed)
V/M left for Pt about normal cea results. Message left to return call if she had any questions.

## 2021-11-27 NOTE — Progress Notes (Signed)
  [place] OFFICE PROGRESS NOTE   Diagnosis: Colon cancer  INTERVAL HISTORY:   Brittney [name] returns as scheduled.  She feels well.  No difficulty with bowel function.  No bleeding.  No complaint.  Objective:  Vital signs in last 24 hours:  Blood pressure (!) 181/86, pulse 83, temperature 97.8 F (36.6 C), temperature source Oral, resp. rate 18, height $RemoveBe'5\' 3"'[id number]$  (1.6 m), weight 151 lb 9.6 oz (68.8 kg), last menstrual period 07/28/2014, SpO2 100 %.    Lymphatics: No cervical, supraclavicular, axillary, or inguinal nodes Resp: Lungs clear bilaterally Cardio: Regular rate and rhythm GI: No hepatosplenomegaly, nontender, no mass Vascular: No leg edema   Portacath/PICC-without erythema  Lab Results:  Lab Results  Component Value Date   WBC 5.8 10/27/2016   HGB 15.5 (H) 10/27/2016   HCT 44.6 10/27/2016   MCV 89.2 10/27/2016   PLT 235 10/27/2016   NEUTROABS 3.9 05/12/2016    CMP  Lab Results  Component Value Date   NA 143 11/30/2018   K 3.9 11/30/2018   CL 108 11/30/2018   CO2 25 11/30/2018   GLUCOSE 104 (H) 11/30/2018   BUN 14 11/30/2018   CREATININE 0.87 11/30/2018   CALCIUM 9.7 11/30/2018   PROT 7.5 08/05/2016   ALBUMIN 4.2 08/05/2016   AST 21 08/05/2016   ALT 36 08/05/2016   ALKPHOS 104 08/05/2016   BILITOT 0.41 08/05/2016   GFRNONAA >60 11/30/2018   GFRAA >60 11/30/2018    Lab Results  Component Value Date   CEA1 1.67 11/28/2020   CEA 0.7 09/09/2015     Medications: I have reviewed the patient's current medications.   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, status post a robotic sigmoid colectomy 10/29/2015 1 of 16 lymph nodes positive for metastatic adenocarcinoma, lymphovascular invasion present Microsatellite stable, no loss of mismatch repair protein expression Cycle 1 adjuvant FOLFOX 12/03/2015 Cycle 2 adjuvant FOLFOX 12/17/2015 Cycle 3 adjuvant FOLFOX adjuvant FOLFOX  01/14/2016 Cycle 5 adjuvant FOLFOX 01/28/2016 Cycle 6 adjuvant FOLFOX 02/11/2016 Cycle 7 adjuvant FOLFOX 02/25/2016 Cycle 8 adjuvant FOLFOX 03/17/2016 Cycle 9 adjuvant FOLFOX 03/31/2016 backslash Cycle 10 adjuvant FOLFOX 04/14/2016 Cycle 11 adjuvant FOLFOX 04/28/2016 (oxaliplatin deleted) Cycle 12 adjuvant FOLFOX 05/12/2016 (oxaliplatin deleted) CT 07/14/2016-no evidence of recurrent disease Surveillance colonoscopy 11/08/2016-tubular adenoma removed from the transverse colon  10/10/2017 CT chest/abdomen/pelvis-no evidence of recurrent disease. 11/30/2018 CTs-no metastatic disease Surveillance colonoscopy 11/27/2019-normal sigmoid anastomosis.  No polyps or cancers. Hypertension G3 P3 Indeterminate bilateral pulmonary nodules on the staging chest CT 09/11/2015, stable on a CT 07/14/2016, stable on CT 10/10/2017, stable lung nodules on CT 11/30/2018 Port-A-Cath placement by Dr. Barry Dienes 11/26/2015 Oxaliplatin neuropathy- resolved     Disposition: Brittney [name] is in clinical remission from colon cancer.  We will follow-up on the CEA from today.  She would like to continue follow-up at the cancer center.  She will return for an office visit in 1 year. She plans to receive a COVID-19 and influenza vaccine at the [place] center today.  [name], MD  11/27/2021  10:49 AM

## 2021-11-27 NOTE — Telephone Encounter (Signed)
-----   Message from Ladell Pier, MD sent at 11/27/2021  3:54 PM EST ----- Please call patient, CEA is normal, follow-up as scheduled

## 2021-11-27 NOTE — Progress Notes (Signed)
   Covid-19 Vaccination Clinic  Name:  Brittney [name]    MRN: [id number] DOB: 06/01/1966  11/27/2021  [name] was observed post Covid-19 immunization for 15 minutes without incident. She was provided with Vaccine Information Sheet and instruction to access the V-Safe system.   [name] was instructed to call 911 with any severe reactions post vaccine: Difficulty breathing  Swelling of face and throat  A fast heartbeat  A bad rash all over body  Dizziness and weakness   Immunizations Administered     Name Date Dose VIS Date Route   Pfizer Covid-19 Vaccine Bivalent Booster 11/27/2021  1:52 PM 0.3 mL 08/19/2021 Intramuscular   Manufacturer: [employer]   Lot: [id number]   [place]: [phone]

## 2021-11-30 ENCOUNTER — Other Ambulatory Visit (HOSPITAL_BASED_OUTPATIENT_CLINIC_OR_DEPARTMENT_OTHER): Payer: Self-pay

## 2022-11-29 ENCOUNTER — Other Ambulatory Visit: Payer: Self-pay | Admitting: *Deleted

## 2022-11-29 ENCOUNTER — Encounter: Payer: Self-pay | Admitting: Oncology

## 2022-11-29 ENCOUNTER — Inpatient Hospital Stay: Payer: 59 | Attending: Oncology

## 2022-11-29 ENCOUNTER — Inpatient Hospital Stay (HOSPITAL_BASED_OUTPATIENT_CLINIC_OR_DEPARTMENT_OTHER): Payer: 59 | Admitting: Oncology

## 2022-11-29 ENCOUNTER — Inpatient Hospital Stay: Payer: 59

## 2022-11-29 VITALS — BP 188/79 | HR 70 | Temp 98.1°F | Resp 18 | Ht 63.0 in | Wt 158.0 lb

## 2022-11-29 DIAGNOSIS — Z23 Encounter for immunization: Secondary | ICD-10-CM

## 2022-11-29 DIAGNOSIS — C187 Malignant neoplasm of sigmoid colon: Secondary | ICD-10-CM

## 2022-11-29 DIAGNOSIS — Z9049 Acquired absence of other specified parts of digestive tract: Secondary | ICD-10-CM | POA: Insufficient documentation

## 2022-11-29 DIAGNOSIS — I10 Essential (primary) hypertension: Secondary | ICD-10-CM | POA: Diagnosis not present

## 2022-11-29 DIAGNOSIS — R918 Other nonspecific abnormal finding of lung field: Secondary | ICD-10-CM | POA: Insufficient documentation

## 2022-11-29 DIAGNOSIS — Z85038 Personal history of other malignant neoplasm of large intestine: Secondary | ICD-10-CM | POA: Diagnosis not present

## 2022-11-29 LAB — CEA (ACCESS): CEA (CHCC): 1.54 ng/mL (ref 0.00–5.00)

## 2022-11-29 MED ORDER — INFLUENZA VAC SPLIT QUAD 0.5 ML IM SUSY
0.5000 mL | PREFILLED_SYRINGE | Freq: Once | INTRAMUSCULAR | Status: AC
  Administered 2022-11-29: 0.5 mL via INTRAMUSCULAR
  Filled 2022-11-29: qty 0.5

## 2022-11-29 NOTE — Progress Notes (Signed)
  [place] OFFICE PROGRESS NOTE   Diagnosis: Colon cancer  INTERVAL HISTORY:   [name] returns as scheduled.  She is seen today with a Spanish interpreter.  She reports feeling well.  Good appetite.  No difficulty with bowel or bladder function.  No bleeding.  No complaint.  She is working.  Objective:  Vital signs in last 24 hours:  Blood pressure (!) 188/79, pulse 70, temperature 98.1 F (36.7 C), temperature source Oral, resp. rate 18, height _0  (1.6 m), weight 158 lb (71.7 kg), last menstrual period 07/28/2014, SpO2 100 %.    Lymphatics: No cervical, supraclavicular, axillary, or inguinal nodes Resp: Lungs clear bilaterally Cardio: Regular rate and rhythm GI: Nontender, no mass, no hepatosplenomegaly Vascular: No leg edema    Lab Results:  Lab Results  Component Value Date   WBC 5.8 10/27/2016   HGB 15.5 (H) 10/27/2016   HCT 44.6 10/27/2016   MCV 89.2 10/27/2016   PLT 235 10/27/2016   NEUTROABS 3.9 05/12/2016    CMP  Lab Results  Component Value Date   NA 143 11/30/2018   K 3.9 11/30/2018   CL 108 11/30/2018   CO2 25 11/30/2018   GLUCOSE 104 (H) 11/30/2018   BUN 14 11/30/2018   CREATININE 0.87 11/30/2018   CALCIUM 9.7 11/30/2018   PROT 7.5 08/05/2016   ALBUMIN 4.2 08/05/2016   AST 21 08/05/2016   ALT 36 08/05/2016   ALKPHOS 104 08/05/2016   BILITOT 0.41 08/05/2016   GFRNONAA >60 11/30/2018   GFRAA >60 11/30/2018    Lab Results  Component Value Date   CEA1 1.67 11/28/2020   CEA 1.54 11/29/2022   Medications: I have reviewed the patient's current medications.   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, status post a robotic sigmoid colectomy 10/29/2015 1 of 16 lymph nodes positive for metastatic adenocarcinoma, lymphovascular invasion present Microsatellite stable, no loss of mismatch repair protein expression Cycle 1 adjuvant FOLFOX 12/03/2015 Cycle 2 adjuvant  FOLFOX 12/17/2015 Cycle 3 adjuvant FOLFOX adjuvant FOLFOX 01/14/2016 Cycle 5 adjuvant FOLFOX 01/28/2016 Cycle 6 adjuvant FOLFOX 02/11/2016 Cycle 7 adjuvant FOLFOX 02/25/2016 Cycle 8 adjuvant FOLFOX 03/17/2016 Cycle 9 adjuvant FOLFOX 03/31/2016 backslash Cycle 10 adjuvant FOLFOX 04/14/2016 Cycle 11 adjuvant FOLFOX 04/28/2016 (oxaliplatin deleted) Cycle 12 adjuvant FOLFOX 05/12/2016 (oxaliplatin deleted) CT 07/14/2016-no evidence of recurrent disease Surveillance colonoscopy 11/08/2016-tubular adenoma removed from the transverse colon  10/10/2017 CT chest/abdomen/pelvis-no evidence of recurrent disease. 11/30/2018 CTs-no metastatic disease Surveillance colonoscopy 11/27/2019-normal sigmoid anastomosis.  No polyps or cancers. Hypertension G3 P3 Indeterminate bilateral pulmonary nodules on the staging chest CT 09/11/2015, stable on a CT 07/14/2016, stable on CT 10/10/2017, stable lung nodules on CT 11/30/2018 Port-A-Cath placement by Dr. Barry Dienes 11/26/2015 Oxaliplatin neuropathy- resolved   Disposition: [name] is in clinical remission from colon cancer.  She would like to continue follow-up with the cancer center.  She will return for an office visit in 1 year.  She continues colonoscopy surveillance with gastroenterology.  She will be due for a colonoscopy in 2025.    [name], MD  11/29/2022  11:20 AM

## 2022-11-29 NOTE — Progress Notes (Signed)
Visit today assisted by [employer] interpreter, Angelica Chessman.

## 2023-11-18 ENCOUNTER — Encounter: Payer: Self-pay | Admitting: Oncology

## 2023-11-25 ENCOUNTER — Inpatient Hospital Stay: Payer: BC Managed Care – PPO

## 2023-11-25 ENCOUNTER — Encounter: Payer: Self-pay | Admitting: Nurse Practitioner

## 2023-11-25 ENCOUNTER — Encounter: Payer: Self-pay | Admitting: Oncology

## 2023-11-25 ENCOUNTER — Inpatient Hospital Stay: Payer: BC Managed Care – PPO | Attending: Nurse Practitioner | Admitting: Nurse Practitioner

## 2023-11-25 VITALS — BP 175/88 | HR 69 | Temp 98.2°F | Resp 18 | Ht 63.0 in | Wt 150.4 lb

## 2023-11-25 DIAGNOSIS — Z85038 Personal history of other malignant neoplasm of large intestine: Secondary | ICD-10-CM | POA: Insufficient documentation

## 2023-11-25 DIAGNOSIS — Z9049 Acquired absence of other specified parts of digestive tract: Secondary | ICD-10-CM | POA: Insufficient documentation

## 2023-11-25 DIAGNOSIS — I10 Essential (primary) hypertension: Secondary | ICD-10-CM | POA: Diagnosis not present

## 2023-11-25 DIAGNOSIS — Z9221 Personal history of antineoplastic chemotherapy: Secondary | ICD-10-CM | POA: Diagnosis not present

## 2023-11-25 DIAGNOSIS — R918 Other nonspecific abnormal finding of lung field: Secondary | ICD-10-CM | POA: Diagnosis not present

## 2023-11-25 DIAGNOSIS — C187 Malignant neoplasm of sigmoid colon: Secondary | ICD-10-CM

## 2023-11-25 LAB — CEA (ACCESS): CEA (CHCC): 1.52 ng/mL (ref 0.00–5.00)

## 2023-11-25 NOTE — Progress Notes (Signed)
  [facility] OFFICE PROGRESS NOTE   Diagnosis: Colon cancer  INTERVAL HISTORY:   [name] returns as scheduled.  She is seen today with a Spanish interpreter.  No change in bowel habits.  No rectal bleeding.  No abdominal pain.  No nausea or vomiting.  She has a good appetite.  No neuropathy symptoms.  We discussed the elevated blood pressure.  She reports a normal blood pressure at home.  Objective:  Vital signs in last 24 hours:  Blood pressure (!) 175/88, pulse 69, temperature 98.2 F (36.8 C), temperature source Temporal, resp. rate 18, height 5\' 3"  (1.6 m), weight 150 lb 6.4 oz (68.2 kg), last menstrual period 07/28/2014, SpO2 100%.    Lymphatics: No palpable cervical, supraclavicular, axillary or inguinal lymph nodes. Resp: Lungs clear bilaterally. Cardio: Regular rate and rhythm. GI: No hepatosplenomegaly.  No mass. Vascular: No leg edema.    Lab Results:  Lab Results  Component Value Date   WBC 5.8 10/27/2016   HGB 15.5 (H) 10/27/2016   HCT 44.6 10/27/2016   MCV 89.2 10/27/2016   PLT 235 10/27/2016   NEUTROABS 3.9 05/12/2016    Imaging:  No results found.  Medications: I have reviewed the patient's current medications.  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, status post a robotic sigmoid colectomy 10/29/2015 1 of 16 lymph nodes positive for metastatic adenocarcinoma, lymphovascular invasion present Microsatellite stable, no loss of mismatch repair protein expression Cycle 1 adjuvant FOLFOX 12/03/2015 Cycle 2 adjuvant FOLFOX 12/17/2015 Cycle 3 adjuvant FOLFOX adjuvant FOLFOX 01/14/2016 Cycle 5 adjuvant FOLFOX 01/28/2016 Cycle 6 adjuvant FOLFOX 02/11/2016 Cycle 7 adjuvant FOLFOX 02/25/2016 Cycle 8 adjuvant FOLFOX 03/17/2016 Cycle 9 adjuvant FOLFOX 03/31/2016 backslash Cycle 10 adjuvant FOLFOX 04/14/2016 Cycle 11 adjuvant FOLFOX 04/28/2016 (oxaliplatin deleted) Cycle 12  adjuvant FOLFOX 05/12/2016 (oxaliplatin deleted) CT 07/14/2016-no evidence of recurrent disease Surveillance colonoscopy 11/08/2016-tubular adenoma removed from the transverse colon  10/10/2017 CT chest/abdomen/pelvis-no evidence of recurrent disease. 11/30/2018 CTs-no metastatic disease Surveillance colonoscopy 11/27/2019-normal sigmoid anastomosis.  No polyps or cancers. Hypertension G3 P3 Indeterminate bilateral pulmonary nodules on the staging chest CT 09/11/2015, stable on a CT 07/14/2016, stable on CT 10/10/2017, stable lung nodules on CT 11/30/2018 Port-A-Cath placement by Dr. Donell Beers 11/26/2015 Oxaliplatin neuropathy- resolved  Disposition: Brittney [name] remains in clinical remission from colon cancer.  We will follow-up on the CEA from today.  She will be due for a surveillance colonoscopy in December 2025.  She would like to continue follow[facility]up at the [facility].  She will return for an office visit in 1 year.    Lonna Cobb ANP/GNP-BC   11/25/2023  11:15 AM

## 2023-11-28 ENCOUNTER — Telehealth: Payer: Self-pay

## 2023-11-28 NOTE — Telephone Encounter (Signed)
-----   Message from Lonna Cobb sent at 11/25/2023  3:17 PM EST ----- Please let her know CEA is stable in normal range

## 2023-11-28 NOTE — Telephone Encounter (Signed)
I attempted to contact the patient multiple times with the interpreter, but unfortunately, I did not receive a response. I left a message informing her that her CEA levels are stable and within the normal range. I advised her to call back if she has any questions. The interpreter assigned to this case is Reuel Boom, and his contact number is [phone].

## 2024-10-17 ENCOUNTER — Telehealth: Payer: Self-pay

## 2024-10-17 NOTE — Telephone Encounter (Signed)
 I attempted to contact the patient multiple times to follow up, but was unable to reach her.

## 2024-10-19 ENCOUNTER — Inpatient Hospital Stay: Attending: Oncology | Admitting: Nurse Practitioner

## 2024-11-22 ENCOUNTER — Encounter: Payer: Self-pay | Admitting: Gastroenterology

## 2024-11-26 ENCOUNTER — Ambulatory Visit: Payer: Self-pay | Admitting: Oncology

## 2024-11-26 ENCOUNTER — Inpatient Hospital Stay

## 2024-11-26 ENCOUNTER — Inpatient Hospital Stay: Payer: BC Managed Care – PPO | Attending: Oncology

## 2024-11-26 ENCOUNTER — Ambulatory Visit: Payer: BC Managed Care – PPO | Admitting: Oncology

## 2024-11-26 ENCOUNTER — Other Ambulatory Visit: Payer: Self-pay | Admitting: *Deleted

## 2024-11-26 VITALS — BP 159/84 | HR 66 | Temp 98.2°F | Resp 18 | Ht 63.0 in | Wt 146.6 lb

## 2024-11-26 DIAGNOSIS — C187 Malignant neoplasm of sigmoid colon: Secondary | ICD-10-CM

## 2024-11-26 DIAGNOSIS — Z9221 Personal history of antineoplastic chemotherapy: Secondary | ICD-10-CM | POA: Diagnosis not present

## 2024-11-26 DIAGNOSIS — Z9049 Acquired absence of other specified parts of digestive tract: Secondary | ICD-10-CM | POA: Diagnosis not present

## 2024-11-26 DIAGNOSIS — Z85038 Personal history of other malignant neoplasm of large intestine: Secondary | ICD-10-CM | POA: Insufficient documentation

## 2024-11-26 DIAGNOSIS — R918 Other nonspecific abnormal finding of lung field: Secondary | ICD-10-CM | POA: Diagnosis not present

## 2024-11-26 DIAGNOSIS — Z23 Encounter for immunization: Secondary | ICD-10-CM

## 2024-11-26 DIAGNOSIS — I10 Essential (primary) hypertension: Secondary | ICD-10-CM | POA: Diagnosis not present

## 2024-11-26 LAB — CEA (ACCESS): CEA (CHCC): 1.53 ng/mL (ref 0.00–5.00)

## 2024-11-26 MED ORDER — INFLUENZA VIRUS VACC SPLIT PF (FLUZONE) 0.5 ML IM SUSY
0.5000 mL | PREFILLED_SYRINGE | Freq: Once | INTRAMUSCULAR | Status: AC
  Administered 2024-11-26: 0.5 mL via INTRAMUSCULAR
  Filled 2024-11-26: qty 0.5

## 2024-11-26 NOTE — Progress Notes (Signed)
 Visit today assisted by spanish interpreter, Alma with [facility], [place]

## 2024-11-26 NOTE — Progress Notes (Signed)
  [facility] OFFICE PROGRESS NOTE   Diagnosis: Colon cancer  INTERVAL HISTORY:   Brittney [name] returns as scheduled.  She feels well.  No difficulty with bowel function.  No bleeding.  She is scheduled for a GI follow-up in January.  She is due for a surveillance colonoscopy.  Objective:  Vital signs in last 24 hours:  Blood pressure (!) 159/84, pulse 66, temperature 98.2 F (36.8 C), temperature source Temporal, resp. rate 18, height 5' 3 (1.6 m), weight 146 lb 9.6 oz (66.5 kg), last menstrual period 07/28/2014, SpO2 100%.    Lymphatics: No cervical, supraclavicular, axillary, or inguinal nodes Resp: Lungs clear bilaterally Cardio: Regular rate and rhythm GI: No mass, no hepatosplenomegaly, nontender Vascular: No leg edema   Lab Results:  Lab Results  Component Value Date   WBC 5.8 10/27/2016   HGB 15.5 (H) 10/27/2016   HCT 44.6 10/27/2016   MCV 89.2 10/27/2016   PLT 235 10/27/2016   NEUTROABS 3.9 05/12/2016    CMP  Lab Results  Component Value Date   NA 143 11/30/2018   K 3.9 11/30/2018   CL 108 11/30/2018   CO2 25 11/30/2018   GLUCOSE 104 (H) 11/30/2018   BUN 14 11/30/2018   CREATININE 0.87 11/30/2018   CALCIUM  9.7 11/30/2018   PROT 7.5 08/05/2016   ALBUMIN 4.2 08/05/2016   AST 21 08/05/2016   ALT 36 08/05/2016   ALKPHOS 104 08/05/2016   BILITOT 0.41 08/05/2016   GFRNONAA >60 11/30/2018   GFRAA >60 11/30/2018    Lab Results  Component Value Date   CEA1 1.67 11/28/2020   CEA 1.52 11/25/2023    Lab Results  Component Value Date   INR 0.99 10/27/2015   LABPROT 13.3 10/27/2015    Imaging:  No results found.  Medications: I have reviewed the patient's current medications.   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, status post a robotic sigmoid colectomy 10/29/2015 1 of 16 lymph nodes positive for metastatic adenocarcinoma, lymphovascular invasion present Microsatellite  stable, no loss of mismatch repair protein expression Cycle 1 adjuvant FOLFOX 12/03/2015 Cycle 2 adjuvant FOLFOX 12/17/2015 Cycle 3 adjuvant FOLFOX adjuvant FOLFOX 01/14/2016 Cycle 5 adjuvant FOLFOX 01/28/2016 Cycle 6 adjuvant FOLFOX 02/11/2016 Cycle 7 adjuvant FOLFOX 02/25/2016 Cycle 8 adjuvant FOLFOX 03/17/2016 Cycle 9 adjuvant FOLFOX 03/31/2016 backslash Cycle 10 adjuvant FOLFOX 04/14/2016 Cycle 11 adjuvant FOLFOX 04/28/2016 (oxaliplatin  deleted) Cycle 12 adjuvant FOLFOX 05/12/2016 (oxaliplatin  deleted) CT 07/14/2016-no evidence of recurrent disease Surveillance colonoscopy 11/08/2016-tubular adenoma removed from the transverse colon  10/10/2017 CT chest/abdomen/pelvis-no evidence of recurrent disease. 11/30/2018 CTs-no metastatic disease Surveillance colonoscopy 11/27/2019-normal sigmoid anastomosis.  No polyps or cancers. Hypertension G3 P3 Indeterminate bilateral pulmonary nodules on the staging chest CT 09/11/2015, stable on a CT 07/14/2016, stable on CT 10/10/2017, stable lung nodules on CT 11/30/2018 Port-A-Cath placement by Dr. Aron 11/26/2015 Oxaliplatin  neuropathy- resolved    Disposition: Brittney [name] is in clinical remission from colon cancer.  We will follow-up on the CEA from today.  She would like to continue follow-up with the cancer center.  She will return for an office visit and CEA in 1 year. She is scheduled for GI follow-up in January and is due for a surveillance colonoscopy.  Brittney [name] received an influenza vaccine today.  Arley Hof, MD  11/26/2024  12:22 PM

## 2024-11-27 ENCOUNTER — Encounter: Payer: Self-pay | Admitting: Oncology

## 2024-11-27 NOTE — Telephone Encounter (Signed)
 Unable to reach patient on phone. Sent Mychart message with normal CEA

## 2024-12-24 ENCOUNTER — Encounter: Payer: Self-pay | Admitting: Gastroenterology

## 2025-01-01 ENCOUNTER — Ambulatory Visit: Admitting: Gastroenterology

## 2025-01-14 ENCOUNTER — Encounter: Payer: Self-pay | Admitting: Oncology

## 2025-01-21 ENCOUNTER — Ambulatory Visit: Admitting: Gastroenterology

## 2025-11-25 ENCOUNTER — Inpatient Hospital Stay

## 2025-11-25 ENCOUNTER — Inpatient Hospital Stay: Admitting: Oncology
